# Patient Record
Sex: Female | Born: 1970 | Race: White | Hispanic: No | Marital: Single | Smoking: Former smoker
Health system: Southern US, Community
[De-identification: ages and names within clinical notes are randomized; demographics above are authoritative.]

## PROBLEM LIST (undated history)

## (undated) DIAGNOSIS — Z87410 Personal history of cervical dysplasia: Secondary | ICD-10-CM

## (undated) DIAGNOSIS — I219 Acute myocardial infarction, unspecified: Secondary | ICD-10-CM

## (undated) DIAGNOSIS — N200 Calculus of kidney: Secondary | ICD-10-CM

## (undated) DIAGNOSIS — Z87891 Personal history of nicotine dependence: Secondary | ICD-10-CM

## (undated) DIAGNOSIS — I2510 Atherosclerotic heart disease of native coronary artery without angina pectoris: Secondary | ICD-10-CM

## (undated) DIAGNOSIS — F1210 Cannabis abuse, uncomplicated: Secondary | ICD-10-CM

## (undated) HISTORY — PX: TUBAL LIGATION: SHX77

## (undated) HISTORY — PX: CHOLECYSTECTOMY: SHX55

## (undated) HISTORY — PX: COLONOSCOPY: SHX174

## (undated) HISTORY — DX: Personal history of nicotine dependence: Z87.891

## (undated) HISTORY — PX: CARDIAC CATHETERIZATION: SHX172

## (undated) HISTORY — DX: Personal history of cervical dysplasia: Z87.410

## (undated) HISTORY — PX: MOUTH SURGERY: SHX715

## (undated) HISTORY — PX: CERVICAL CONIZATION W/BX: SHX1330

## (undated) HISTORY — DX: Calculus of kidney: N20.0

## (undated) HISTORY — PX: LAPAROSCOPIC OVARIAN CYSTECTOMY: SUR786

## (undated) HISTORY — DX: Atherosclerotic heart disease of native coronary artery without angina pectoris: I25.10

## (undated) HISTORY — PX: ENDOMETRIAL ABLATION: SHX621

## (undated) HISTORY — DX: Cannabis abuse, uncomplicated: F12.10

---

## 1997-03-21 ENCOUNTER — Ambulatory Visit (HOSPITAL_COMMUNITY): Admission: RE | Admit: 1997-03-21 | Discharge: 1997-03-21 | Payer: Self-pay | Admitting: Obstetrics and Gynecology

## 1997-04-30 ENCOUNTER — Emergency Department (HOSPITAL_COMMUNITY): Admission: EM | Admit: 1997-04-30 | Discharge: 1997-04-30 | Payer: Self-pay | Admitting: Emergency Medicine

## 1998-09-30 ENCOUNTER — Emergency Department (HOSPITAL_COMMUNITY): Admission: EM | Admit: 1998-09-30 | Discharge: 1998-09-30 | Payer: Self-pay

## 1998-12-31 ENCOUNTER — Ambulatory Visit (HOSPITAL_COMMUNITY): Admission: RE | Admit: 1998-12-31 | Discharge: 1998-12-31 | Payer: Self-pay | Admitting: Internal Medicine

## 1998-12-31 ENCOUNTER — Encounter: Payer: Self-pay | Admitting: Internal Medicine

## 1999-10-05 ENCOUNTER — Inpatient Hospital Stay (HOSPITAL_COMMUNITY): Admission: AD | Admit: 1999-10-05 | Discharge: 1999-10-05 | Payer: Self-pay | Admitting: Obstetrics and Gynecology

## 1999-10-05 ENCOUNTER — Encounter: Payer: Self-pay | Admitting: Obstetrics and Gynecology

## 1999-11-02 ENCOUNTER — Inpatient Hospital Stay (HOSPITAL_COMMUNITY): Admission: AD | Admit: 1999-11-02 | Discharge: 1999-11-02 | Payer: Self-pay | Admitting: Obstetrics and Gynecology

## 1999-11-05 ENCOUNTER — Observation Stay (HOSPITAL_COMMUNITY): Admission: RE | Admit: 1999-11-05 | Discharge: 1999-11-05 | Payer: Self-pay | Admitting: Obstetrics and Gynecology

## 1999-12-18 ENCOUNTER — Emergency Department (HOSPITAL_COMMUNITY): Admission: EM | Admit: 1999-12-18 | Discharge: 1999-12-18 | Payer: Self-pay

## 2000-01-13 ENCOUNTER — Inpatient Hospital Stay (HOSPITAL_COMMUNITY): Admission: AD | Admit: 2000-01-13 | Discharge: 2000-01-13 | Payer: Self-pay | Admitting: Obstetrics & Gynecology

## 2000-02-14 ENCOUNTER — Inpatient Hospital Stay (HOSPITAL_COMMUNITY): Admission: AD | Admit: 2000-02-14 | Discharge: 2000-02-14 | Payer: Self-pay | Admitting: Obstetrics and Gynecology

## 2000-02-22 ENCOUNTER — Inpatient Hospital Stay (HOSPITAL_COMMUNITY): Admission: AD | Admit: 2000-02-22 | Discharge: 2000-02-27 | Payer: Self-pay | Admitting: Obstetrics and Gynecology

## 2000-02-23 ENCOUNTER — Encounter: Payer: Self-pay | Admitting: Obstetrics & Gynecology

## 2000-03-05 ENCOUNTER — Inpatient Hospital Stay (HOSPITAL_COMMUNITY): Admission: AD | Admit: 2000-03-05 | Discharge: 2000-03-05 | Payer: Self-pay | Admitting: Obstetrics and Gynecology

## 2000-03-08 ENCOUNTER — Inpatient Hospital Stay (HOSPITAL_COMMUNITY): Admission: AD | Admit: 2000-03-08 | Discharge: 2000-03-08 | Payer: Self-pay | Admitting: Obstetrics and Gynecology

## 2000-03-13 ENCOUNTER — Inpatient Hospital Stay (HOSPITAL_COMMUNITY): Admission: AD | Admit: 2000-03-13 | Discharge: 2000-03-16 | Payer: Self-pay | Admitting: Obstetrics and Gynecology

## 2000-03-17 ENCOUNTER — Encounter: Admission: RE | Admit: 2000-03-17 | Discharge: 2000-04-14 | Payer: Self-pay | Admitting: Obstetrics and Gynecology

## 2000-05-16 ENCOUNTER — Emergency Department (HOSPITAL_COMMUNITY): Admission: EM | Admit: 2000-05-16 | Discharge: 2000-05-16 | Payer: Self-pay | Admitting: Emergency Medicine

## 2001-02-13 ENCOUNTER — Other Ambulatory Visit: Admission: RE | Admit: 2001-02-13 | Discharge: 2001-02-13 | Payer: Self-pay | Admitting: Obstetrics and Gynecology

## 2001-04-13 ENCOUNTER — Encounter: Payer: Self-pay | Admitting: Urology

## 2001-04-13 ENCOUNTER — Encounter: Admission: RE | Admit: 2001-04-13 | Discharge: 2001-04-13 | Payer: Self-pay | Admitting: Urology

## 2001-04-14 ENCOUNTER — Observation Stay (HOSPITAL_COMMUNITY): Admission: EM | Admit: 2001-04-14 | Discharge: 2001-04-15 | Payer: Self-pay | Admitting: *Deleted

## 2001-04-14 ENCOUNTER — Encounter: Payer: Self-pay | Admitting: General Surgery

## 2001-07-18 ENCOUNTER — Emergency Department (HOSPITAL_COMMUNITY): Admission: EM | Admit: 2001-07-18 | Discharge: 2001-07-18 | Payer: Self-pay

## 2001-09-27 ENCOUNTER — Emergency Department (HOSPITAL_COMMUNITY): Admission: EM | Admit: 2001-09-27 | Discharge: 2001-09-27 | Payer: Self-pay | Admitting: Emergency Medicine

## 2001-10-13 ENCOUNTER — Emergency Department (HOSPITAL_COMMUNITY): Admission: EM | Admit: 2001-10-13 | Discharge: 2001-10-13 | Payer: Self-pay | Admitting: Emergency Medicine

## 2001-10-13 ENCOUNTER — Encounter: Payer: Self-pay | Admitting: Emergency Medicine

## 2001-10-17 ENCOUNTER — Emergency Department (HOSPITAL_COMMUNITY): Admission: EM | Admit: 2001-10-17 | Discharge: 2001-10-17 | Payer: Self-pay | Admitting: Emergency Medicine

## 2001-12-03 ENCOUNTER — Inpatient Hospital Stay (HOSPITAL_COMMUNITY): Admission: EM | Admit: 2001-12-03 | Discharge: 2001-12-05 | Payer: Self-pay | Admitting: Psychiatry

## 2001-12-29 ENCOUNTER — Encounter: Admission: RE | Admit: 2001-12-29 | Discharge: 2001-12-29 | Payer: Self-pay | Admitting: Psychiatry

## 2002-02-23 ENCOUNTER — Encounter: Admission: RE | Admit: 2002-02-23 | Discharge: 2002-02-23 | Payer: Self-pay | Admitting: *Deleted

## 2002-04-23 ENCOUNTER — Encounter: Admission: RE | Admit: 2002-04-23 | Discharge: 2002-04-23 | Payer: Self-pay | Admitting: *Deleted

## 2002-05-15 ENCOUNTER — Encounter: Admission: RE | Admit: 2002-05-15 | Discharge: 2002-06-07 | Payer: Self-pay | Admitting: Rheumatology

## 2002-08-17 ENCOUNTER — Encounter: Admission: RE | Admit: 2002-08-17 | Discharge: 2002-08-17 | Payer: Self-pay | Admitting: *Deleted

## 2002-08-20 ENCOUNTER — Encounter: Payer: Self-pay | Admitting: Emergency Medicine

## 2002-08-20 ENCOUNTER — Emergency Department (HOSPITAL_COMMUNITY): Admission: EM | Admit: 2002-08-20 | Discharge: 2002-08-20 | Payer: Self-pay | Admitting: Emergency Medicine

## 2002-12-30 ENCOUNTER — Emergency Department (HOSPITAL_COMMUNITY): Admission: EM | Admit: 2002-12-30 | Discharge: 2002-12-30 | Payer: Self-pay | Admitting: Emergency Medicine

## 2003-06-01 ENCOUNTER — Emergency Department (HOSPITAL_COMMUNITY): Admission: EM | Admit: 2003-06-01 | Discharge: 2003-06-01 | Payer: Self-pay | Admitting: Emergency Medicine

## 2003-06-27 ENCOUNTER — Emergency Department (HOSPITAL_COMMUNITY): Admission: EM | Admit: 2003-06-27 | Discharge: 2003-06-28 | Payer: Self-pay | Admitting: Emergency Medicine

## 2003-07-30 ENCOUNTER — Inpatient Hospital Stay (HOSPITAL_COMMUNITY): Admission: EM | Admit: 2003-07-30 | Discharge: 2003-08-02 | Payer: Self-pay | Admitting: Emergency Medicine

## 2003-12-15 ENCOUNTER — Emergency Department (HOSPITAL_COMMUNITY): Admission: EM | Admit: 2003-12-15 | Discharge: 2003-12-16 | Payer: Self-pay | Admitting: Emergency Medicine

## 2003-12-16 ENCOUNTER — Emergency Department (HOSPITAL_COMMUNITY): Admission: EM | Admit: 2003-12-16 | Discharge: 2003-12-16 | Payer: Self-pay | Admitting: Emergency Medicine

## 2004-01-16 ENCOUNTER — Ambulatory Visit (HOSPITAL_COMMUNITY): Admission: RE | Admit: 2004-01-16 | Discharge: 2004-01-16 | Payer: Self-pay | Admitting: Internal Medicine

## 2004-01-24 ENCOUNTER — Ambulatory Visit (HOSPITAL_COMMUNITY): Admission: RE | Admit: 2004-01-24 | Discharge: 2004-01-24 | Payer: Self-pay | Admitting: Internal Medicine

## 2004-02-05 ENCOUNTER — Ambulatory Visit (HOSPITAL_COMMUNITY): Payer: Self-pay | Admitting: Psychiatry

## 2004-03-24 ENCOUNTER — Emergency Department (HOSPITAL_COMMUNITY): Admission: EM | Admit: 2004-03-24 | Discharge: 2004-03-25 | Payer: Self-pay | Admitting: Emergency Medicine

## 2004-04-15 ENCOUNTER — Ambulatory Visit (HOSPITAL_COMMUNITY): Payer: Self-pay | Admitting: Psychiatry

## 2004-05-11 ENCOUNTER — Ambulatory Visit (HOSPITAL_COMMUNITY): Payer: Self-pay | Admitting: Psychiatry

## 2004-06-08 ENCOUNTER — Ambulatory Visit (HOSPITAL_COMMUNITY): Payer: Self-pay | Admitting: Psychiatry

## 2004-07-25 ENCOUNTER — Emergency Department (HOSPITAL_COMMUNITY): Admission: EM | Admit: 2004-07-25 | Discharge: 2004-07-25 | Payer: Self-pay | Admitting: Emergency Medicine

## 2004-09-01 ENCOUNTER — Encounter: Admission: RE | Admit: 2004-09-01 | Discharge: 2004-09-01 | Payer: Self-pay | Admitting: Internal Medicine

## 2004-09-02 ENCOUNTER — Ambulatory Visit (HOSPITAL_COMMUNITY): Payer: Self-pay | Admitting: Psychiatry

## 2004-09-09 ENCOUNTER — Ambulatory Visit (HOSPITAL_COMMUNITY): Payer: Self-pay | Admitting: Psychiatry

## 2004-09-16 ENCOUNTER — Ambulatory Visit (HOSPITAL_COMMUNITY): Payer: Self-pay | Admitting: Psychiatry

## 2004-09-23 ENCOUNTER — Ambulatory Visit (HOSPITAL_COMMUNITY): Payer: Self-pay | Admitting: Psychiatry

## 2004-10-06 ENCOUNTER — Emergency Department (HOSPITAL_COMMUNITY): Admission: EM | Admit: 2004-10-06 | Discharge: 2004-10-06 | Payer: Self-pay | Admitting: *Deleted

## 2004-10-07 ENCOUNTER — Emergency Department (HOSPITAL_COMMUNITY): Admission: EM | Admit: 2004-10-07 | Discharge: 2004-10-07 | Payer: Self-pay | Admitting: Emergency Medicine

## 2004-10-14 ENCOUNTER — Ambulatory Visit (HOSPITAL_COMMUNITY): Payer: Self-pay | Admitting: Psychiatry

## 2004-10-21 ENCOUNTER — Ambulatory Visit (HOSPITAL_COMMUNITY): Payer: Self-pay | Admitting: Psychiatry

## 2004-11-17 ENCOUNTER — Ambulatory Visit (HOSPITAL_COMMUNITY): Payer: Self-pay | Admitting: Psychiatry

## 2005-03-24 ENCOUNTER — Emergency Department (HOSPITAL_COMMUNITY): Admission: EM | Admit: 2005-03-24 | Discharge: 2005-03-24 | Payer: Self-pay | Admitting: Emergency Medicine

## 2005-03-27 ENCOUNTER — Emergency Department (HOSPITAL_COMMUNITY): Admission: EM | Admit: 2005-03-27 | Discharge: 2005-03-28 | Payer: Self-pay | Admitting: Emergency Medicine

## 2010-02-08 ENCOUNTER — Encounter: Payer: Self-pay | Admitting: Internal Medicine

## 2013-12-07 ENCOUNTER — Telehealth: Payer: Self-pay | Admitting: Cardiology

## 2013-12-07 ENCOUNTER — Emergency Department: Payer: Self-pay | Admitting: Emergency Medicine

## 2013-12-07 LAB — URINALYSIS, COMPLETE
BILIRUBIN, UR: NEGATIVE
Glucose,UR: NEGATIVE mg/dL (ref 0–75)
Ketone: NEGATIVE
Leukocyte Esterase: NEGATIVE
NITRITE: NEGATIVE
PH: 6 (ref 4.5–8.0)
Protein: NEGATIVE
SPECIFIC GRAVITY: 1.002 (ref 1.003–1.030)
Squamous Epithelial: 18
WBC UR: 2 /HPF (ref 0–5)

## 2013-12-07 LAB — CBC WITH DIFFERENTIAL/PLATELET
Basophil #: 0.1 10*3/uL (ref 0.0–0.1)
Basophil %: 0.7 %
Eosinophil #: 0.2 10*3/uL (ref 0.0–0.7)
Eosinophil %: 1.4 %
HCT: 46.4 % (ref 35.0–47.0)
HGB: 14.9 g/dL (ref 12.0–16.0)
Lymphocyte #: 1.5 10*3/uL (ref 1.0–3.6)
Lymphocyte %: 12.3 %
MCH: 31.6 pg (ref 26.0–34.0)
MCHC: 32 g/dL (ref 32.0–36.0)
MCV: 99 fL (ref 80–100)
Monocyte #: 0.6 x10 3/mm (ref 0.2–0.9)
Monocyte %: 5 %
Neutrophil #: 9.9 10*3/uL — ABNORMAL HIGH (ref 1.4–6.5)
Neutrophil %: 80.6 %
Platelet: 261 10*3/uL (ref 150–440)
RBC: 4.69 10*6/uL (ref 3.80–5.20)
RDW: 14.6 % — ABNORMAL HIGH (ref 11.5–14.5)
WBC: 12.3 10*3/uL — ABNORMAL HIGH (ref 3.6–11.0)

## 2013-12-07 LAB — COMPREHENSIVE METABOLIC PANEL
ALBUMIN: 4.1 g/dL (ref 3.4–5.0)
ALK PHOS: 78 U/L
AST: 20 U/L (ref 15–37)
Anion Gap: 5 — ABNORMAL LOW (ref 7–16)
BUN: 11 mg/dL (ref 7–18)
Bilirubin,Total: 0.4 mg/dL (ref 0.2–1.0)
CREATININE: 0.86 mg/dL (ref 0.60–1.30)
Calcium, Total: 9.4 mg/dL (ref 8.5–10.1)
Chloride: 111 mmol/L — ABNORMAL HIGH (ref 98–107)
Co2: 25 mmol/L (ref 21–32)
EGFR (African American): >60
Glucose: 84 mg/dL (ref 65–99)
OSMOLALITY: 280 (ref 275–301)
POTASSIUM: 4 mmol/L (ref 3.5–5.1)
SGPT (ALT): 25 U/L
Sodium: 141 mmol/L (ref 136–145)
Total Protein: 8.6 g/dL — ABNORMAL HIGH (ref 6.4–8.2)

## 2013-12-07 LAB — TROPONIN I: Troponin-I: <0.02 ng/mL

## 2013-12-07 NOTE — Telephone Encounter (Signed)
Pt is Molly Ellis and dc williams granddaughter, her aunt and uncle also see Dr. Mare Ferrari, EKG showed prior damage at [facility], wants to see Dr. Mare Ferrari as a new pt, will he take her on? pls call [phone]

## 2013-12-07 NOTE — Telephone Encounter (Signed)
Yes we could see her as a new patient

## 2013-12-07 NOTE — Telephone Encounter (Signed)
The pt is advised that Dr Mare Ferrari and his nurse are not in the office at this time and that I will forward this message to them to address when they return on Monday 11/23. She verbalized understanding.

## 2013-12-10 ENCOUNTER — Ambulatory Visit (INDEPENDENT_AMBULATORY_CARE_PROVIDER_SITE_OTHER): Payer: Medicaid Other | Admitting: Cardiology

## 2013-12-10 ENCOUNTER — Encounter: Payer: Self-pay | Admitting: Cardiology

## 2013-12-10 VITALS — BP 130/70 | HR 61 | Ht 66.0 in | Wt 110.0 lb

## 2013-12-10 DIAGNOSIS — E7800 Pure hypercholesterolemia, unspecified: Secondary | ICD-10-CM

## 2013-12-10 DIAGNOSIS — I4510 Unspecified right bundle-branch block: Secondary | ICD-10-CM

## 2013-12-10 DIAGNOSIS — R0789 Other chest pain: Secondary | ICD-10-CM

## 2013-12-10 DIAGNOSIS — G43109 Migraine with aura, not intractable, without status migrainosus: Secondary | ICD-10-CM

## 2013-12-10 DIAGNOSIS — Z720 Tobacco use: Secondary | ICD-10-CM

## 2013-12-10 DIAGNOSIS — G43909 Migraine, unspecified, not intractable, without status migrainosus: Secondary | ICD-10-CM

## 2013-12-10 DIAGNOSIS — R079 Chest pain, unspecified: Secondary | ICD-10-CM

## 2013-12-10 HISTORY — DX: Pure hypercholesterolemia, unspecified: E78.00

## 2013-12-10 HISTORY — DX: Tobacco use: Z72.0

## 2013-12-10 HISTORY — DX: Chest pain, unspecified: R07.9

## 2013-12-10 HISTORY — DX: Unspecified right bundle-branch block: I45.10

## 2013-12-10 HISTORY — DX: Migraine, unspecified, not intractable, without status migrainosus: G43.909

## 2013-12-10 NOTE — Patient Instructions (Signed)
START ASPIRIN 81 MG DAILY   Your physician has requested that you have en exercise stress myoview. For further information please visit [email or web address]. Please follow instruction sheet, as given.  Follow up as needed 

## 2013-12-10 NOTE — Telephone Encounter (Signed)
Patient scheduled for appointment today with  Dr. Mare Ferrari, patient aware

## 2013-12-10 NOTE — Progress Notes (Signed)
Molly Ellis Date of Birth:  1970/01/20 [facility] [address] [place], [place]  [address] [phone]        Fax   [phone]   History of Present Illness: This pleasant 43 year old woman is seen by me for the first time today.  I see her parents the Pansters and her maternal grandparents the Delmita. The patient is self-referred today.  She was recently seen in [facility] emergency room for left-sided chest discomfort she was seen on 12/07/13.  Her chest x-ray was normal.  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  Historically there is no prior history of known heart problems.  She gives a history that she has had exertional chest discomfort in the left lateral chest radiating down the left arm.  It seems to be relieved by rest or by taking Goody powders.  Goody powders containing aspirin.  In September 2015 she was seen in an emergency room after having syncope  after emerging from a hot tub. Her family history reveals that her mother has heart problems.  Her social history reveals that she smokes a pack of cigarettes a day.  She has a past history of elevated cholesterol but is not currently taking any cholesterol medication.  He has a past history of Lyme disease.  Her family physician is Dr. Lin Landsman at [facility] family practice in [place].  Current Outpatient Prescriptions  Medication Sig Dispense Refill  . aspirin 81 MG tablet Take 81 mg by mouth daily.    . Aspirin-Acetaminophen-Caffeine (GOODY HEADACHE PO) Take by mouth.     No current facility-administered medications for this visit.    Allergies  Allergen Reactions  . Penicillins Other (See Comments)    Patient Active Problem List   Diagnosis Date Noted  . Chest pain of uncertain etiology 75/10/2583  . RBBB 12/10/2013  . Hypercholesterolemia 12/10/2013  . Migraine 12/10/2013  . Tobacco abuse 12/10/2013     History  Smoking status  . Former Smoker  Smokeless tobacco  . Not on file    History  Alcohol Use  . Yes    Family History  Problem Relation Age of Onset  . Asthma Mother   . Hypertension Mother   . Heart failure Maternal Grandfather   . Heart attack Maternal Grandfather     Review of Systems: Constitutional: no fever chills diaphoresis or fatigue or change in weight.  Head and neck: no hearing loss, no epistaxis, no photophobia or visual disturbance. Respiratory: No cough, shortness of breath or wheezing. Cardiovascular: No chest pain peripheral edema, palpitations. Gastrointestinal: No abdominal distention, no abdominal pain, no change in bowel habits hematochezia or melena. Genitourinary: No dysuria, no frequency, no urgency, no nocturia. Musculoskeletal:No arthralgias, no back pain, no gait disturbance or myalgias. Neurological: No dizziness, no headaches, no numbness, no seizures, no syncope, no weakness, no tremors. Hematologic: No lymphadenopathy, no easy bruising. Psychiatric: No confusion, no hallucinations, no sleep disturbance.   Wt Readings from Last 3 Encounters:  12/10/13 110 lb (49.896 kg)    Physical Exam: Filed Vitals:   12/10/13 1600  BP: 130/70  Pulse: 61   The patient appears to be in no distress.  Head and neck exam reveals that the pupils are equal and reactive.  The extraocular movements are full.  There is no scleral icterus.  Mouth and pharynx are benign.  No lymphadenopathy.  No carotid bruits.  The jugular venous pressure  is normal.  Thyroid is not enlarged or tender.  Chest is clear to percussion and auscultation.  No rales or rhonchi.  Expansion of the chest is symmetrical.  Heart reveals no abnormal lift or heave.  First and second heart sounds are normal.  There is no murmur gallop rub or click.  The abdomen is soft and nontender.  Bowel sounds are normoactive.  There is no hepatosplenomegaly or mass.  There are no abdominal  bruits.  Extremities reveal no phlebitis or edema.  Pedal pulses are good.  There is no cyanosis or clubbing.  Neurologic exam is normal strength and no lateralizing weakness.  No sensory deficits.  Integument reveals no rash  EKG today shows normal sinus rhythm, right bundle branch block, and deep narrow Q waves in the inferior leads cannot rule out inferior infarct.  Previous tracing of 12/07/13, no significant change.  Assessment / Plan: 1.  Atypical chest pain uncertain etiology 2.  Abnormal electrocardiogram with right bundle branch block and questionable old inferior wall MI 3.  Ongoing tobacco abuse 1 pack a day 4.  History of migraine headache 5.  History of hypercholesterolemia  Disposition: We will have the patient return for a treadmill Myoview stress test.  She is to take a baby aspirin once a day

## 2013-12-12 ENCOUNTER — Telehealth: Payer: Self-pay | Admitting: Cardiology

## 2013-12-12 ENCOUNTER — Ambulatory Visit (HOSPITAL_COMMUNITY): Payer: Medicaid Other | Attending: Cardiology | Admitting: Radiology

## 2013-12-12 DIAGNOSIS — R079 Chest pain, unspecified: Secondary | ICD-10-CM | POA: Insufficient documentation

## 2013-12-12 DIAGNOSIS — Z681 Body mass index (BMI) 19 or less, adult: Secondary | ICD-10-CM | POA: Insufficient documentation

## 2013-12-12 DIAGNOSIS — I4510 Unspecified right bundle-branch block: Secondary | ICD-10-CM | POA: Diagnosis not present

## 2013-12-12 DIAGNOSIS — R0789 Other chest pain: Secondary | ICD-10-CM

## 2013-12-12 MED ORDER — TECHNETIUM TC 99M SESTAMIBI GENERIC - CARDIOLITE
10.0000 | Freq: Once | INTRAVENOUS | Status: AC | PRN
  Administered 2013-12-12: 10 via INTRAVENOUS

## 2013-12-12 MED ORDER — TECHNETIUM TC 99M SESTAMIBI GENERIC - CARDIOLITE
30.0000 | Freq: Once | INTRAVENOUS | Status: AC | PRN
  Administered 2013-12-12: 30 via INTRAVENOUS

## 2013-12-12 NOTE — Telephone Encounter (Signed)
Spoke with patient and she stated she was continuing to have chest pains since myoview.  She had been having chest pains constant since last week rating 2/10, after myoview pain up to 5/10 Discussed with  Dr. Mare Ferrari and he recommended to rest and if worse go to ED  Advised patient, verbalized understanding.

## 2013-12-12 NOTE — Telephone Encounter (Signed)
New message      Want nuclear results----pt was seen this am.  She said someone told her we would have the results back this pm.  Pt is still having occasional chest pain (just like she was having this am during the study)

## 2013-12-12 NOTE — Progress Notes (Signed)
[facility] [address] [place], [place] [address] [phone]    Cardiology Nuclear Med Study  Molly Ellis is a 43 y.o. female     MRN : [id number]     DOB: [birth year]  Procedure Date: 12/12/2013  Nuclear Med Background Indication for Stress Test:  Evaluation for Ischemia, Abnormal EKG, and Patient seen in hospital on 12-07-2013 for Chest Pain ([place]) History:  No known CAD Cardiac Risk Factors: Lipids and RBBB  Symptoms:  Chest Pain, Chest Pain with Exertion (chest discomfort is currently a 2/10) and Syncope   Nuclear Pre-Procedure Caffeine/Decaff Intake:  None> 12 hrs NPO After: 8:00pm   Lungs:  clear O2 Sat: 98% on room air. IV 0.9% NS with Angio Cath:  24g  IV Site: L Antecubital x 1, tolerated well IV Started by:  Irven Baltimore, RN  Chest Size (in):  32 Cup Size: B  Height: 5\' 6"  (1.676 m)  Weight:  107 lb (48.535 kg)  BMI:  Body mass index is 17.28 kg/(m^2). Tech Comments:  N/A    Nuclear Med Study 1 or 2 day study: 1 day  Stress Test Type:  Stress  Reading MD: N/A  Order Authorizing Provider:  Darlin Coco, MD  Resting Radionuclide: Technetium 31m Sestamibi  Resting Radionuclide Dose: 11.0 mCi   Stress Radionuclide:  Technetium 31m Sestamibi  Stress Radionuclide Dose: 33.0 mCi           Stress Protocol Rest HR: 49 Stress HR: 153  Rest BP: 113/78 Stress BP: 169/78  Exercise Time (min): 6:00 METS: fatigue   Predicted Max HR: 178 bpm % Max HR: 85.96 bpm Rate Pressure Product: 63149   Dose of Adenosine (mg):  n/a Dose of Lexiscan: n/a mg  Dose of Atropine (mg): n/a Dose of Dobutamine: n/a mcg/kg/min (at max HR)  Stress Test Technologist: Glade Lloyd, BS-ES  Nuclear Technologist:  Earl Many, CNMT     Rest Procedure:  Myocardial perfusion imaging was performed at rest 45 minutes following the intravenous administration of Technetium 47m Sestamibi. Rest ECG: NSR-RBBB  Stress Procedure:  The patient exercised  on the treadmill utilizing the Bruce Protocol for 6:00 minutes. The patient stopped due to fatigue and had a increase from a 2/10 chest pain at rest to a 5/10 chest pain during exercise.  Technetium 32m Sestamibi was injected at peak exercise and myocardial perfusion imaging was performed after a brief delay.  Stress ECG: No significant change from baseline ECG  QPS Raw Data Images:  Normal; no motion artifact; normal heart/lung ratio. Stress Images:  Small, mild mid anterior perfusion defect.  Rest Images:  Small, mild mid anterior perfusion defect.  Subtraction (SDS):  Fixed small, mild mid anterior perfusion defect.  Transient Ischemic Dilatation (Normal <1.22):  1.02 Lung/Heart Ratio (Normal <0.45):  0.31  Quantitative Gated Spect Images QGS EDV:  69 ml QGS ESV:  19 ml  Impression Exercise Capacity:  Poor exercise capacity. BP Response:  Normal blood pressure response. Clinical Symptoms:  Chest pain ECG Impression:  RBBB, no significant change with exercise. Comparison with Prior Nuclear Study: No images to compare  Overall Impression:  Low risk stress nuclear study with a small, mild mid anterior fixed perfusion defect.  This likely represents breast attenuation.  No ischemia.  The patient did have chest pain and poor exercise tolerance based on her age.   LV Ejection Fraction: 73%.  LV Wall Motion:  NL LV Function; NL Wall Motion   [employer]  12/12/2013          

## 2013-12-14 ENCOUNTER — Telehealth: Payer: Self-pay | Admitting: Cardiology

## 2013-12-14 NOTE — Telephone Encounter (Signed)
Follow Up    Pt calling following up on test results. Please call.

## 2013-12-14 NOTE — Telephone Encounter (Signed)
Advised patient we do not have these results yet, and she was not happy because she was told they would be ready.

## 2013-12-17 NOTE — Telephone Encounter (Signed)
Advised patient of results and  Dr. Mare Ferrari recommended following up with PCP

## 2013-12-17 NOTE — Telephone Encounter (Signed)
-----   Message from Darlin Coco, MD sent at 12/14/2013  5:44 PM EST ----- Please report.  Stress test was normal.  No ischemia. EF normal. Send copy to Dr. Lin Landsman at [facility] family practice.

## 2013-12-27 ENCOUNTER — Encounter: Payer: Self-pay | Admitting: Cardiology

## 2013-12-30 ENCOUNTER — Emergency Department: Payer: Self-pay | Admitting: Emergency Medicine

## 2014-01-18 DIAGNOSIS — I219 Acute myocardial infarction, unspecified: Secondary | ICD-10-CM

## 2014-01-18 HISTORY — DX: Acute myocardial infarction, unspecified: I21.9

## 2016-04-04 ENCOUNTER — Encounter (HOSPITAL_COMMUNITY): Payer: Self-pay | Admitting: Emergency Medicine

## 2016-04-04 ENCOUNTER — Emergency Department (HOSPITAL_COMMUNITY): Payer: Medicaid Other

## 2016-04-04 ENCOUNTER — Emergency Department (HOSPITAL_COMMUNITY)
Admission: EM | Admit: 2016-04-04 | Discharge: 2016-04-04 | Disposition: A | Discharge status: Home / Self Care | Payer: Medicaid Other | Attending: Emergency Medicine | Admitting: Emergency Medicine

## 2016-04-04 DIAGNOSIS — J9811 Atelectasis: Secondary | ICD-10-CM | POA: Diagnosis not present

## 2016-04-04 DIAGNOSIS — R079 Chest pain, unspecified: Secondary | ICD-10-CM | POA: Diagnosis not present

## 2016-04-04 DIAGNOSIS — Z87891 Personal history of nicotine dependence: Secondary | ICD-10-CM | POA: Insufficient documentation

## 2016-04-04 DIAGNOSIS — Z7982 Long term (current) use of aspirin: Secondary | ICD-10-CM | POA: Insufficient documentation

## 2016-04-04 DIAGNOSIS — R0602 Shortness of breath: Secondary | ICD-10-CM | POA: Diagnosis present

## 2016-04-04 HISTORY — DX: Acute myocardial infarction, unspecified: I21.9

## 2016-04-04 LAB — I-STAT TROPONIN, ED
TROPONIN I, POC: 0 ng/mL (ref 0.00–0.08)
Troponin i, poc: 0 ng/mL (ref 0.00–0.08)

## 2016-04-04 LAB — I-STAT BETA HCG BLOOD, ED (MC, WL, AP ONLY)

## 2016-04-04 LAB — BASIC METABOLIC PANEL
Anion gap: 5 (ref 5–15)
BUN: 12 mg/dL (ref 6–20)
CHLORIDE: 108 mmol/L (ref 101–111)
CO2: 23 mmol/L (ref 22–32)
CREATININE: 0.92 mg/dL (ref 0.44–1.00)
Calcium: 8.8 mg/dL — ABNORMAL LOW (ref 8.9–10.3)
GFR calc Af Amer: >60 mL/min (ref >60)
GFR calc non Af Amer: >60 mL/min (ref >60)
Glucose, Bld: 100 mg/dL — ABNORMAL HIGH (ref 65–99)
Potassium: 3.7 mmol/L (ref 3.5–5.1)
SODIUM: 136 mmol/L (ref 135–145)

## 2016-04-04 LAB — CBC
HEMATOCRIT: 36.7 % (ref 36.0–46.0)
HEMOGLOBIN: 12.3 g/dL (ref 12.0–15.0)
MCH: 31.9 pg (ref 26.0–34.0)
MCHC: 33.5 g/dL (ref 30.0–36.0)
MCV: 95.3 fL (ref 78.0–100.0)
PLATELETS: 232 10*3/uL (ref 150–400)
RBC: 3.85 MIL/uL — AB (ref 3.87–5.11)
RDW: 13.5 % (ref 11.5–15.5)
WBC: 6.8 10*3/uL (ref 4.0–10.5)

## 2016-04-04 LAB — T4, FREE: FREE T4: 1.12 ng/dL (ref 0.61–1.12)

## 2016-04-04 LAB — TSH: TSH: 2.811 u[IU]/mL (ref 0.350–4.500)

## 2016-04-04 LAB — D-DIMER, QUANTITATIVE: D-Dimer, Quant: 0.58 ug/mL-FEU — ABNORMAL HIGH (ref 0.00–0.50)

## 2016-04-04 MED ORDER — METHOCARBAMOL 500 MG PO TABS: 1000.0000 mg | ORAL_TABLET | Freq: Three times a day (TID) | ORAL | 0 refills | Status: DC | PRN

## 2016-04-04 MED ORDER — IBUPROFEN 600 MG PO TABS: 600.0000 mg | ORAL_TABLET | Freq: Three times a day (TID) | ORAL | 0 refills | Status: DC

## 2016-04-04 MED ORDER — KETOROLAC TROMETHAMINE 30 MG/ML IJ SOLN
30.0000 mg | Freq: Once | INTRAMUSCULAR | Status: AC
  Administered 2016-04-04: 30 mg via INTRAVENOUS
  Filled 2016-04-04: qty 1

## 2016-04-04 MED ORDER — SODIUM CHLORIDE 0.9 % IV BOLUS (SEPSIS)
1000.0000 mL | Freq: Once | INTRAVENOUS | Status: AC
Start: 2016-04-04 — End: 2016-04-04
  Administered 2016-04-04: 1000 mL via INTRAVENOUS

## 2016-04-04 MED ORDER — IOPAMIDOL (ISOVUE-370) INJECTION 76%
INTRAVENOUS | Status: AC
  Administered 2016-04-04: 100 mL
  Filled 2016-04-04: qty 100

## 2016-04-04 MED ORDER — SODIUM CHLORIDE 0.9 % IV BOLUS (SEPSIS)
1000.0000 mL | Freq: Once | INTRAVENOUS | Status: AC
  Administered 2016-04-04: 1000 mL via INTRAVENOUS

## 2016-04-04 NOTE — ED Notes (Signed)
Patient transported to X-ray 

## 2016-04-04 NOTE — ED Notes (Signed)
Attempted IV x2. 

## 2016-04-04 NOTE — ED Notes (Signed)
Attempted PIV for CTA. Aaron Edelman, RN will attempt.

## 2016-04-04 NOTE — ED Triage Notes (Signed)
Pt BIB EMS from her son's apartment where pt woke up from nap with 8/10 CP. Pt has hx MI x 2 years ago. Pt in afib with EMS, in NSR on arrival. Pt received 1 nitro, 4mg  zofran, and 324 asp with ems. Resp e/u; A&O x 4. NAD noted at this time.

## 2016-04-04 NOTE — ED Notes (Signed)
Rn at bedside attempting [place] IV

## 2016-04-04 NOTE — ED Notes (Signed)
Iv team at bedside  

## 2016-04-04 NOTE — ED Notes (Signed)
Patient transported to CT 

## 2016-04-04 NOTE — ED Notes (Signed)
Pt c/o of pain with inspiration

## 2016-04-04 NOTE — ED Notes (Signed)
ED Provider at bedside. 

## 2016-04-04 NOTE — ED Notes (Signed)
IV team at bedside 

## 2016-04-04 NOTE — ED Provider Notes (Signed)
[facility] DEPT Provider Note   CSN: [id number] Arrival date & time: 04/04/16  1600     History   Chief Complaint Chief Complaint  Patient presents with  . Chest Pain    HPI Molly Ellis is a 46 y.o. female.  HPI She presents with left-sided chest pain that she describes as sharp. Located under the left axilla. He has had some shortness of breath associated with this. Describes as difficulty taking a full breath. Symptoms started roughly 30 minutes prior to presentation. No fever, chills or cough. No new lower extremity swelling or pain. No recent extended travel or immobilization. Patient thinks she's had 2 small "microinfarcts in the past. States she seen Dr. Mare Ferrari for this. She had no treatment at that time. She smokes 4-10 cigarettes daily. Grandfather with history of MI at an early age. Given aspirin and nitroglycerin in route. Pain is now improved to 4/10. EMS noted patient to be tachycardic en route. Questioned atrial fibrillation on run strip. Past Medical History:  Diagnosis Date  . Heart attack 01/2014  . Kidney stones     Patient Active Problem List   Diagnosis Date Noted  . Chest pain of uncertain etiology 93/81/8299  . RBBB 12/10/2013  . Hypercholesterolemia 12/10/2013  . Migraine 12/10/2013  . Tobacco abuse 12/10/2013    Past Surgical History:  Procedure Laterality Date  . CARDIAC CATHETERIZATION    . CHOLECYSTECTOMY      OB History    No data available       Home Medications    Prior to Admission medications   Medication Sig Start Date End Date Taking? Authorizing Provider  Aspirin-Acetaminophen-Caffeine (GOODY HEADACHE PO) Take 1 packet by mouth daily as needed (for headaches).    Yes Historical Provider, MD  loratadine (CLARITIN) 10 MG tablet Take 10 mg by mouth every other day.   Yes Historical Provider, MD  PROAIR HFA 108 [phone] Base) MCG/ACT inhaler Inhale 1-2 puffs into the lungs See admin instructions. Every four to six hours as  needed for shortness of breath or wheezing (seasonal allergies) 03/12/16  Yes Historical Provider, MD  ibuprofen (ADVIL,MOTRIN) 600 MG tablet Take 1 tablet (600 mg total) by mouth 3 (three) times daily after meals. 04/04/16   Julianne Rice, MD  methocarbamol (ROBAXIN) 500 MG tablet Take 2 tablets (1,000 mg total) by mouth every 8 (eight) hours as needed. 04/04/16   Julianne Rice, MD    Family History Family History  Problem Relation Age of Onset  . Asthma Mother   . Hypertension Mother   . Heart failure Maternal Grandfather   . Heart attack Maternal Grandfather     Social History Social History  Substance Use Topics  . Smoking status: Former Research scientist (life sciences)  . Smokeless tobacco: Not on file  . Alcohol use Yes     Allergies   Penicillins; Codeine; and Other   Review of Systems Review of Systems  Constitutional: Negative for chills and fever.  Respiratory: Positive for shortness of breath. Negative for cough and chest tightness.   Cardiovascular: Positive for chest pain. Negative for palpitations and leg swelling.  Gastrointestinal: Negative for abdominal pain, constipation, diarrhea, nausea and vomiting.  Musculoskeletal: Positive for back pain and myalgias. Negative for neck pain.  Skin: Negative for rash and wound.  Neurological: Negative for dizziness, weakness, light-headedness, numbness and headaches.  All other systems reviewed and are negative.    Physical Exam Updated Vital Signs BP 110/62   Pulse 70   Temp 98.2 F (  36.8 C) (Oral)   Resp 17   Ht 5\' 6"  (1.676 m)   Wt 125 lb (56.7 kg)   SpO2 100%   BMI 20.18 kg/m   Physical Exam  Constitutional: She is oriented to person, place, and time. She appears well-developed and well-nourished. No distress.  HENT:  Head: Normocephalic and atraumatic.  Mouth/Throat: Oropharynx is clear and moist. No oropharyngeal exudate.  Eyes: EOM are normal. Pupils are equal, round, and reactive to light.  Neck: Normal range of motion.  Neck supple. No thyromegaly present.  Cardiovascular: Normal rate and regular rhythm.  Exam reveals no gallop and no friction rub.   No murmur heard. Pulmonary/Chest: Effort normal and breath sounds normal. No respiratory distress. She has no wheezes. She has no rales. She exhibits no tenderness.  Abdominal: Soft. Bowel sounds are normal. There is no tenderness. There is no rebound and no guarding.  Musculoskeletal: Normal range of motion. She exhibits tenderness. She exhibits no edema.  Patient with left sided thoracic muscular tenderness to palpation. There is no midline thoracic or lumbar tenderness. No CVA tenderness. No lower extremity swelling, asymmetry or tenderness. 2+ distal pulses.  Neurological: She is alert and oriented to person, place, and time.  Moving all extremities without deficit. Sensation fully intact.  Skin: Skin is warm and dry. Capillary refill takes less than 2 seconds. No rash noted. No erythema.  Psychiatric: She has a normal mood and affect. Her behavior is normal.  Nursing note and vitals reviewed.    ED Treatments / Results  Labs (all labs ordered are listed, but only abnormal results are displayed) Labs Reviewed  BASIC METABOLIC PANEL - Abnormal; Notable for the following:       Result Value   Glucose, Bld 100 (*)    Calcium 8.8 (*)    All other components within normal limits  CBC - Abnormal; Notable for the following:    RBC 3.85 (*)    All other components within normal limits  D-DIMER, QUANTITATIVE (NOT AT [facility]) - Abnormal; Notable for the following:    D-Dimer, Quant 0.58 (*)    All other components within normal limits  TSH  T4, FREE  I-STAT TROPOININ, ED  I-STAT BETA HCG BLOOD, ED ([facility], [facility], [facility] ONLY)  I-STAT TROPOININ, ED    EKG  EKG Interpretation  Date/Time:  Sunday April 04 2016 16:09:50 EDT Ventricular Rate:  65 PR Interval:    QRS Duration: 118 QT Interval:  374 QTC Calculation: 389 R Axis:   106 Text Interpretation:  Sinus  rhythm IRBBB and LPFB Confirmed by Lita Mains  MD, Shedric Fredericks ([id number]) on 04/04/2016 4:39:33 PM       Radiology No results found.  Procedures Procedures (including critical care time)  Medications Ordered in ED Medications  sodium chloride 0.9 % bolus 1,000 mL (0 mLs Intravenous Stopped 04/04/16 1811)  ketorolac (TORADOL) 30 MG/ML injection 30 mg (30 mg Intravenous Given 04/04/16 2116)  sodium chloride 0.9 % bolus 1,000 mL (0 mLs Intravenous Stopped 04/04/16 2258)  iopamidol (ISOVUE-370) 76 % injection (100 mLs  Contrast Given 04/04/16 2124)     Initial Impression / Assessment and Plan / ED Course  I have reviewed the triage vital signs and the nursing notes.  Pertinent labs & imaging results that were available during my care of the patient were reviewed by me and considered in my medical decision making (see chart for details).    Review of patient's run strips demonstrate sinus tachycardia with no evidence of  atrial fibrillation. Patient does have right bundle block branch block which is old. Was evaluated by Dr. Mare Ferrari in 2015 and had a stress test which was negative at that time. No definite history of prior myocardial infarction. She did have some Q waves in inferior leads on prior EKGs which did not appear on EKG performed today. Patient has CT angiogram of the chest which is negative for PE but does socially atelectasis in the left lung. Repeat troponin is normal. Low suspicion for coronary artery disease. We'll treat for chest wall pain and have follow-up as an outpatient. Return precautions given. Final Clinical Impressions(s) / ED Diagnoses   Final diagnoses:  Nonspecific chest pain  Atelectasis of left lung    New Prescriptions Discharge Medication List as of 04/04/2016 10:42 PM    START taking these medications   Details  ibuprofen (ADVIL,MOTRIN) 600 MG tablet Take 1 tablet (600 mg total) by mouth 3 (three) times daily after meals., Starting Sun 04/04/2016, Print      methocarbamol (ROBAXIN) 500 MG tablet Take 2 tablets (1,000 mg total) by mouth every 8 (eight) hours as needed., Starting Sun 04/04/2016, Print         Julianne Rice, MD 04/07/16 2135

## 2016-09-15 ENCOUNTER — Encounter: Payer: Self-pay | Admitting: Neurology

## 2016-12-28 ENCOUNTER — Ambulatory Visit: Payer: Medicaid Other | Admitting: Neurology

## 2017-01-28 ENCOUNTER — Encounter: Payer: Self-pay | Admitting: Neurology

## 2017-01-28 ENCOUNTER — Ambulatory Visit: Payer: Medicaid Other | Admitting: Neurology

## 2017-01-28 VITALS — BP 100/70 | HR 58 | Ht 66.0 in | Wt 135.4 lb

## 2017-01-28 DIAGNOSIS — R404 Transient alteration of awareness: Secondary | ICD-10-CM

## 2017-01-28 NOTE — Patient Instructions (Signed)
I don't think you are having TIAs.  I don't really suspect seizures, however it must be ruled out.  It is most likely complicated migraine or stress-related  We will check a sleep-deprived EEG.  If it shows any abnormalities that suggest risk for seizures, then I want to see you back to discuss possible anti-seizure medications.
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
## 2017-02-03 ENCOUNTER — Ambulatory Visit (INDEPENDENT_AMBULATORY_CARE_PROVIDER_SITE_OTHER): Payer: Medicaid Other | Admitting: Neurology

## 2017-02-03 DIAGNOSIS — R404 Transient alteration of awareness: Secondary | ICD-10-CM | POA: Diagnosis not present

## 2017-02-03 NOTE — Progress Notes (Signed)
SLEEP-DEPRIVED ELECTROENCEPHALOGRAM REPORT  Date of Study: 02/03/2017  Patient's Name: Molly Ellis MRN: [id number] Date of Birth: [birth year]  Referring Provider: Metta Clines, DO  Clinical History: 47 year old female with history of recurrent TIA spells, presenting as confusion and right sided weakness and numbness.   Medications: aspirin EC 81 MG tablet  ZYRTEC 10 MG tablet  GOODY HEADACHE PO ADVIL,MOTRIN 600 MG tablet  CLARITIN 10 MG tablet  PROAIR HFA 108 (90 Base) MCG/ACT inhaler   Technical Summary: A multichannel digital EEG recording measured by the international 10-20 system with electrodes applied with paste and impedances below 5000 ohms performed in our laboratory with EKG monitoring in an awake and asleep patient.  Hyperventilation and photic stimulation were performed.  The digital EEG was referentially recorded, reformatted, and digitally filtered in a variety of bipolar and referential montages for optimal display.    Description: The patient is awake and asleep during the recording.  During maximal wakefulness, there is a symmetric, medium voltage 11 Hz posterior dominant rhythm that attenuates with eye opening.  The record is symmetric.  During drowsiness and sleep, there is an increase in theta slowing of the background.  Vertex waves and symmetric sleep spindles were seen.  Hyperventilation and photic stimulation did not elicit any abnormalities.  There were no epileptiform discharges or electrographic seizures seen.    EKG lead was unremarkable.  Impression: This awake and asleep sleep-deprived EEG is normal.    Clinical Correlation: A normal EEG does not exclude a clinical diagnosis of epilepsy.  If further clinical questions remain, prolonged EEG may be helpful.  Clinical correlation is advised.   Metta Clines, DO

## 2017-02-10 ENCOUNTER — Telehealth: Payer: Self-pay | Admitting: Neurology

## 2017-02-10 NOTE — Telephone Encounter (Signed)
Patient called to check her results from her Sleep deprived EEG she had last week. Please Call. Thanks

## 2017-02-10 NOTE — Telephone Encounter (Signed)
Ok to advise Pt of normal EEG?

## 2017-02-11 NOTE — Telephone Encounter (Signed)
Pt called informed her that her EEG was normal and she wants to know where to go from here with her headaches she is having, please advise

## 2017-02-11 NOTE — Telephone Encounter (Signed)
Yes, let her know the EEG is normal.

## 2017-02-11 NOTE — Telephone Encounter (Signed)
Called Pt, LM on VM

## 2017-02-16 NOTE — Telephone Encounter (Signed)
She needs to make a follow up appointment since it is a separate issue

## 2017-02-16 NOTE — Telephone Encounter (Signed)
Called and spoke with Pt, she is having almost daily headaches in the back of her head, though not all of them progress into migraines. She said there was a discussion of returning, if headaches continued, to consider treatment. Please advise.

## 2017-02-16 NOTE — Telephone Encounter (Signed)
Called and spoke with Pt, advsd her will need to be seen, trx her to front office to make appt

## 2017-02-21 ENCOUNTER — Encounter: Payer: Self-pay | Admitting: Neurology

## 2017-02-21 ENCOUNTER — Ambulatory Visit: Payer: Medicaid Other | Admitting: Neurology

## 2017-02-21 VITALS — BP 102/78 | HR 97 | Ht 66.0 in | Wt 128.0 lb

## 2017-02-21 DIAGNOSIS — G43009 Migraine without aura, not intractable, without status migrainosus: Secondary | ICD-10-CM

## 2017-02-21 NOTE — Patient Instructions (Signed)
Migraine Recommendations: 1.  We will hold off on starting a daily preventative medication for now. 2.  Take ibuprofen 800mg  or Goody at earliest onset of headache. 3.  Limit use of pain relievers to no more than 2 days out of the week.  These medications include acetaminophen, ibuprofen, triptans and narcotics.  This will help reduce risk of rebound headaches. 4.  Be aware of common food triggers such as processed sweets, processed foods with nitrites (such as deli meat, hot dogs, sausages), foods with MSG, alcohol (such as wine), chocolate, certain cheeses, certain fruits (dried fruits, bananas, some citrus fruit), vinegar, diet soda. 4.  Avoid caffeine 5.  Routine exercise 6.  Proper sleep hygiene 7.  Stay adequately hydrated with water 8.  Keep a headache diary. 9.  Maintain proper stress management. 10.  Do not skip meals. 11.  Consider supplements:  Magnesium citrate 400mg  to 600mg  daily, riboflavin 400mg , Coenzyme Q 10 100mg  three times daily 12.  Follow up in 3 months.
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
## 2017-05-27 ENCOUNTER — Ambulatory Visit: Payer: Medicaid Other | Admitting: Neurology

## 2017-11-02 ENCOUNTER — Emergency Department: Payer: Medicaid Other

## 2017-11-02 ENCOUNTER — Encounter: Payer: Self-pay | Admitting: Emergency Medicine

## 2017-11-02 ENCOUNTER — Emergency Department
Admission: EM | Admit: 2017-11-02 | Discharge: 2017-11-02 | Disposition: A | Discharge status: Home / Self Care | Payer: Medicaid Other | Attending: Emergency Medicine | Admitting: Emergency Medicine

## 2017-11-02 DIAGNOSIS — N83201 Unspecified ovarian cyst, right side: Secondary | ICD-10-CM

## 2017-11-02 DIAGNOSIS — Z7982 Long term (current) use of aspirin: Secondary | ICD-10-CM | POA: Diagnosis not present

## 2017-11-02 DIAGNOSIS — N12 Tubulo-interstitial nephritis, not specified as acute or chronic: Secondary | ICD-10-CM

## 2017-11-02 DIAGNOSIS — N10 Acute tubulo-interstitial nephritis: Secondary | ICD-10-CM | POA: Insufficient documentation

## 2017-11-02 DIAGNOSIS — Z79899 Other long term (current) drug therapy: Secondary | ICD-10-CM | POA: Insufficient documentation

## 2017-11-02 DIAGNOSIS — Z87891 Personal history of nicotine dependence: Secondary | ICD-10-CM | POA: Insufficient documentation

## 2017-11-02 DIAGNOSIS — R102 Pelvic and perineal pain: Secondary | ICD-10-CM

## 2017-11-02 DIAGNOSIS — R1031 Right lower quadrant pain: Secondary | ICD-10-CM | POA: Diagnosis present

## 2017-11-02 LAB — CBC
HEMATOCRIT: 44.7 % (ref 36.0–46.0)
HEMOGLOBIN: 14.4 g/dL (ref 12.0–15.0)
MCH: 31.7 pg (ref 26.0–34.0)
MCHC: 32.2 g/dL (ref 30.0–36.0)
MCV: 98.5 fL (ref 80.0–100.0)
NRBC: 0 % (ref 0.0–0.2)
Platelets: 342 10*3/uL (ref 150–400)
RBC: 4.54 MIL/uL (ref 3.87–5.11)
RDW: 13.2 % (ref 11.5–15.5)
WBC: 11.6 10*3/uL — AB (ref 4.0–10.5)

## 2017-11-02 LAB — BASIC METABOLIC PANEL
ANION GAP: 7 (ref 5–15)
BUN: 10 mg/dL (ref 6–20)
CHLORIDE: 105 mmol/L (ref 98–111)
CO2: 26 mmol/L (ref 22–32)
Calcium: 9.2 mg/dL (ref 8.9–10.3)
Creatinine, Ser: 0.73 mg/dL (ref 0.44–1.00)
GFR calc non Af Amer: >60 mL/min (ref >60)
Glucose, Bld: 120 mg/dL — ABNORMAL HIGH (ref 70–99)
POTASSIUM: 4.1 mmol/L (ref 3.5–5.1)
Sodium: 138 mmol/L (ref 135–145)

## 2017-11-02 LAB — URINALYSIS, COMPLETE (UACMP) WITH MICROSCOPIC
Bilirubin Urine: NEGATIVE
Glucose, UA: NEGATIVE mg/dL
Ketones, ur: NEGATIVE mg/dL
NITRITE: POSITIVE — AB
Protein, ur: NEGATIVE mg/dL
SPECIFIC GRAVITY, URINE: 1.014 (ref 1.005–1.030)
pH: 6 (ref 5.0–8.0)

## 2017-11-02 LAB — POCT PREGNANCY, URINE: Preg Test, Ur: NEGATIVE

## 2017-11-02 MED ORDER — ONDANSETRON HCL 4 MG PO TABS
4.0000 mg | ORAL_TABLET | Freq: Once | ORAL | Status: AC
  Administered 2017-11-02: 4 mg via ORAL
  Filled 2017-11-02: qty 1

## 2017-11-02 MED ORDER — FLUCONAZOLE 150 MG PO TABS: 150.0000 mg | ORAL_TABLET | Freq: Once | ORAL | 0 refills | Status: DC | PRN

## 2017-11-02 MED ORDER — OXYCODONE-ACETAMINOPHEN 5-325 MG PO TABS
1.0000 | ORAL_TABLET | Freq: Once | ORAL | Status: AC
Start: 2017-11-02 — End: 2017-11-02
  Administered 2017-11-02: 1 via ORAL
  Filled 2017-11-02: qty 1

## 2017-11-02 MED ORDER — CEPHALEXIN 500 MG PO CAPS
500.0000 mg | ORAL_CAPSULE | Freq: Once | ORAL | Status: AC
  Administered 2017-11-02: 500 mg via ORAL
  Filled 2017-11-02: qty 1

## 2017-11-02 MED ORDER — ONDANSETRON HCL 4 MG PO TABS
ORAL_TABLET | ORAL | Status: AC
  Filled 2017-11-02: qty 1

## 2017-11-02 MED ORDER — CEPHALEXIN 500 MG PO CAPS: 500.0000 mg | ORAL_CAPSULE | Freq: Three times a day (TID) | ORAL | 0 refills | Status: AC

## 2017-11-02 NOTE — ED Notes (Signed)
Pt c/o right flank pain that started around 10am this morning with nausea. Denies vomiting at present. States she has a hx of kidney stones and this feels like one. Pt is pacing in room. Skin is warm and dry.

## 2017-11-02 NOTE — ED Triage Notes (Signed)
Pt reports pain to right flank that started 2 hours ago, reports thinks she has a stone. Pt reports hx of stones and states it feels like one.

## 2017-11-02 NOTE — ED Provider Notes (Signed)
[facility] Emergency Department Provider Note  ___________________________________________   First MD Initiated Contact with Patient 11/02/17 1404     (approximate)  I have reviewed the triage vital signs and the nursing notes.   HISTORY  Chief Complaint Flank Pain and Nausea   HPI Molly Ellis is a 47 y.o. female history of kidney stones was presented to the emergency department with right lower flank pain that started suddenly at approximately 11 AM this morning.  She states that the pain is constant and has been increasing.  Associated with nausea.  Says that she has had difficulty urinating as well.  No urinary symptoms prior to the start of the pain this morning.  Says that if this feels similar to previous kidney stones.  Has passed all previous kidney stones on her own without any intervention and had her last stone approximate 1 year ago.  Denies any fever or chills.   Past Medical History:  Diagnosis Date  . Heart attack ([place]) 01/2014  . Kidney stones   . Kidney stones     Patient Active Problem List   Diagnosis Date Noted  . Chest pain of uncertain etiology 42/70/6237  . RBBB 12/10/2013  . Hypercholesterolemia 12/10/2013  . Migraine 12/10/2013  . Tobacco abuse 12/10/2013    Past Surgical History:  Procedure Laterality Date  . CARDIAC CATHETERIZATION    . CHOLECYSTECTOMY      Prior to Admission medications   Medication Sig Start Date End Date Taking? Authorizing Provider  aspirin EC 81 MG tablet Take 81 mg by mouth daily.    [provider]  Aspirin-Acetaminophen-Caffeine (GOODY HEADACHE PO) Take 1 packet by mouth daily as needed (for headaches).     [provider]  cetirizine (ZYRTEC) 10 MG tablet Take 10 mg by mouth daily.    [provider]  ibuprofen (ADVIL,MOTRIN) 600 MG tablet Take 1 tablet (600 mg total) by mouth 3 (three) times daily after meals. Patient not taking: Reported on 01/28/2017  04/04/16   Julianne Rice, MD  loratadine (CLARITIN) 10 MG tablet Take 10 mg by mouth every other day.    [provider]  methocarbamol (ROBAXIN) 500 MG tablet Take 2 tablets (1,000 mg total) by mouth every 8 (eight) hours as needed. Patient not taking: Reported on 01/28/2017 04/04/16   Julianne Rice, MD  PROAIR HFA 108 [phone] Base) MCG/ACT inhaler Inhale 1-2 puffs into the lungs See admin instructions. Every four to six hours as needed for shortness of breath or wheezing (seasonal allergies) 03/12/16   [provider]    Allergies Penicillins; Codeine; and Other  Family History  Problem Relation Age of Onset  . Asthma Mother   . Hypertension Mother   . Heart failure Maternal Grandfather   . Heart attack Maternal Grandfather     Social History Social History   Tobacco Use  . Smoking status: Former Research scientist (life sciences)  . Smokeless tobacco: Never Used  Substance Use Topics  . Alcohol use: Yes  . Drug use: No    Review of Systems  Constitutional: No fever/chills Eyes: No visual changes. ENT: No sore throat. Cardiovascular: Denies chest pain. Respiratory: Denies shortness of breath. Gastrointestinal: No abdominal pain.  no vomiting.  No diarrhea.  No constipation. Genitourinary: Negative for dysuria. Musculoskeletal: As above Skin: Negative for rash. Neurological: Negative for headaches, focal weakness or numbness.   ____________________________________________   PHYSICAL EXAM:  VITAL SIGNS: ED Triage Vitals  Enc Vitals Group  BP 11/02/17 1212 128/81     Pulse Rate 11/02/17 1212 86     Resp 11/02/17 1212 16     Temp 11/02/17 1212 98.2 F (36.8 C)     Temp Source 11/02/17 1212 Oral     SpO2 11/02/17 1212 97 %     Weight 11/02/17 1150 122 lb (55.3 kg)     Height 11/02/17 1150 5\' 6"  (1.676 m)     Head Circumference --      Peak Flow --      Pain Score 11/02/17 1150 8     Pain Loc --      Pain Edu? --      Excl. in [place]? --     Constitutional: Alert  and oriented.  Patient appears uncomfortable.  Pacing around the room. Eyes: Conjunctivae are normal.  Head: Atraumatic. Nose: No congestion/rhinnorhea. Mouth/Throat: Mucous membranes are moist.  Neck: No stridor.   Cardiovascular: Normal rate, regular rhythm. Grossly normal heart sounds.   Respiratory: Normal respiratory effort.  No retractions. Lungs CTAB. Gastrointestinal: Soft and nontender. No distention.  Positive for mild to moderate right-sided CVA tenderness to palpation. Musculoskeletal: No lower extremity tenderness nor edema.  No joint effusions. Neurologic:  Normal speech and language. No gross focal neurologic deficits are appreciated. Skin:  Skin is warm, dry and intact. No rash noted. Psychiatric: Mood and affect are normal. Speech and behavior are normal.  ____________________________________________   LABS (all labs ordered are listed, but only abnormal results are displayed)  Labs Reviewed  URINALYSIS, COMPLETE (UACMP) WITH MICROSCOPIC - Abnormal; Notable for the following components:      Result Value   Color, Urine YELLOW (*)    APPearance CLOUDY (*)    Hgb urine dipstick SMALL (*)    Nitrite POSITIVE (*)    Leukocytes, UA TRACE (*)    Bacteria, UA RARE (*)    All other components within normal limits  BASIC METABOLIC PANEL - Abnormal; Notable for the following components:   Glucose, Bld 120 (*)    All other components within normal limits  CBC - Abnormal; Notable for the following components:   WBC 11.6 (*)    All other components within normal limits   ____________________________________________  EKG   ____________________________________________  RADIOLOGY  CT the abdomen and pelvis without any kidney stones.  However, found to have a right-sided ovarian cyst.  3 cm simple appearing right-sided ovarian cyst.  No signs of torsion. ____________________________________________   PROCEDURES  Procedure(s) performed:   Procedures  Critical  Care performed:   ____________________________________________   INITIAL IMPRESSION / ASSESSMENT AND PLAN / ED COURSE  Pertinent labs & imaging results that were available during my care of the patient were reviewed by me and considered in my medical decision making (see chart for details).  Differential diagnosis includes, but is not limited to, ovarian cyst, ovarian torsion, acute appendicitis, diverticulitis, urinary tract infection/pyelonephritis, endometriosis, bowel obstruction, colitis, renal colic, gastroenteritis, hernia, fibroids, endometriosis, pregnancy related pain including ectopic pregnancy, etc. As part of my medical decision making, I reviewed the following data within the electronic MEDICAL RECORD NUMBER Notes from prior ED visits  ----------------------------------------- 3:36 PM on 11/02/2017 -----------------------------------------  Patient updated about CT results.  Says that she has had a history of ovarian cysts which required surgeries.  Denies any vaginal bleeding or discharge.  Says that she has already been tested negative for STDs 3 times this year.  Says that she has multiple sexual partners and is part  of the "kink community."  ----------------------------------------- 6:44 PM on 11/02/2017 -----------------------------------------  Patient resting comfortably at this time.  Updated about ovarian cyst.  Most likely pyelonephritis causing the patient's issues.  No signs of torsion on the ultrasound.  Also patient with constant pain and not coming going as would be more expected with intermittent torsion.  I will get follow-up with OB/GYN.  She will be discharged with Keflex.  She understands diagnosis as well as treatment and willing to comply. ____________________________________________   FINAL CLINICAL IMPRESSION(S) / ED DIAGNOSES  Ovarian cyst.  Pyelonephritis.  NEW MEDICATIONS STARTED DURING THIS VISIT:  New Prescriptions   No medications on file      Note:  This document was prepared using Dragon voice recognition software and may include unintentional dictation errors.     Orbie Pyo, MD 11/02/17 [phone]

## 2017-11-03 ENCOUNTER — Encounter: Payer: Self-pay | Admitting: *Deleted

## 2017-11-03 ENCOUNTER — Emergency Department
Admission: EM | Admit: 2017-11-03 | Discharge: 2017-11-03 | Disposition: A | Discharge status: Home / Self Care | Payer: Medicaid Other | Attending: Emergency Medicine | Admitting: Emergency Medicine

## 2017-11-03 ENCOUNTER — Other Ambulatory Visit: Payer: Self-pay

## 2017-11-03 DIAGNOSIS — Z7982 Long term (current) use of aspirin: Secondary | ICD-10-CM | POA: Diagnosis not present

## 2017-11-03 DIAGNOSIS — Z87891 Personal history of nicotine dependence: Secondary | ICD-10-CM | POA: Insufficient documentation

## 2017-11-03 DIAGNOSIS — Z79899 Other long term (current) drug therapy: Secondary | ICD-10-CM | POA: Insufficient documentation

## 2017-11-03 DIAGNOSIS — R1031 Right lower quadrant pain: Secondary | ICD-10-CM | POA: Diagnosis not present

## 2017-11-03 LAB — COMPREHENSIVE METABOLIC PANEL
ALK PHOS: 67 U/L (ref 38–126)
ALT: 18 U/L (ref 0–44)
ANION GAP: 10 (ref 5–15)
AST: 23 U/L (ref 15–41)
Albumin: 4.1 g/dL (ref 3.5–5.0)
BILIRUBIN TOTAL: 0.5 mg/dL (ref 0.3–1.2)
BUN: 13 mg/dL (ref 6–20)
CALCIUM: 9.4 mg/dL (ref 8.9–10.3)
CO2: 25 mmol/L (ref 22–32)
Chloride: 104 mmol/L (ref 98–111)
Creatinine, Ser: 0.79 mg/dL (ref 0.44–1.00)
GFR calc non Af Amer: >60 mL/min (ref >60)
Glucose, Bld: 101 mg/dL — ABNORMAL HIGH (ref 70–99)
Potassium: 4.1 mmol/L (ref 3.5–5.1)
Sodium: 139 mmol/L (ref 135–145)
TOTAL PROTEIN: 8 g/dL (ref 6.5–8.1)

## 2017-11-03 LAB — CBC
HCT: 42.5 % (ref 36.0–46.0)
Hemoglobin: 13.8 g/dL (ref 12.0–15.0)
MCH: 32.3 pg (ref 26.0–34.0)
MCHC: 32.5 g/dL (ref 30.0–36.0)
MCV: 99.5 fL (ref 80.0–100.0)
NRBC: 0 % (ref 0.0–0.2)
Platelets: 334 10*3/uL (ref 150–400)
RBC: 4.27 MIL/uL (ref 3.87–5.11)
RDW: 13.2 % (ref 11.5–15.5)
WBC: 10.7 10*3/uL — ABNORMAL HIGH (ref 4.0–10.5)

## 2017-11-03 MED ORDER — TRAMADOL HCL 50 MG PO TABS: 50.0000 mg | ORAL_TABLET | Freq: Four times a day (QID) | ORAL | 0 refills | Status: DC | PRN

## 2017-11-03 MED ORDER — TRAMADOL HCL 50 MG PO TABS
100.0000 mg | ORAL_TABLET | Freq: Once | ORAL | Status: AC
  Administered 2017-11-03: 100 mg via ORAL
  Filled 2017-11-03: qty 2

## 2017-11-03 MED ORDER — PROMETHAZINE HCL 25 MG PO TABS
25.0000 mg | ORAL_TABLET | Freq: Once | ORAL | Status: AC
  Administered 2017-11-03: 25 mg via ORAL
  Filled 2017-11-03: qty 1

## 2017-11-03 NOTE — ED Provider Notes (Signed)
[facility] Emergency Department Provider Note  Time seen: 6:16 PM  I have reviewed the triage vital signs and the nursing notes.   HISTORY  Chief Complaint Abdominal Pain    HPI Molly Ellis is a 47 y.o. female with a past medical history of kidney stones, presents to the emergency department for right lower quadrant abdominal pain.  This is a second interest the patient has been expensing right lower quadrant abdominal pain.  Patient was seen here yesterday in the emergency department had a largely negative work-up, besides a 3 cm ovarian cyst.  CT scan of the abdomen/pelvis was negative.  Lab work is reassuring.  Patient denies any fever.  States she was having vaginal spotting today but denies any discharge or fluid leakage.  She does state multiple sexual partners but states regular STD testing, and denies any symptoms of STDs.  Patient states she has an OB/GYN appointment tomorrow morning.  States she returned today because the pain worsened so she came to the emergency department.  She also states mild vaginal spotting which started after her pelvic ultrasound yesterday.   Past Medical History:  Diagnosis Date  . Heart attack ([place]) 01/2014  . Kidney stones   . Kidney stones     Patient Active Problem List   Diagnosis Date Noted  . Chest pain of uncertain etiology 28/31/5176  . RBBB 12/10/2013  . Hypercholesterolemia 12/10/2013  . Migraine 12/10/2013  . Tobacco abuse 12/10/2013    Past Surgical History:  Procedure Laterality Date  . CARDIAC CATHETERIZATION    . CHOLECYSTECTOMY      Prior to Admission medications   Medication Sig Start Date End Date Taking? Authorizing Provider  aspirin EC 81 MG tablet Take 81 mg by mouth daily.    [provider]  Aspirin-Acetaminophen-Caffeine (GOODY HEADACHE PO) Take 1 packet by mouth daily as needed (for headaches).     [provider]  cephALEXin (KEFLEX) 500 MG capsule Take 1 capsule  (500 mg total) by mouth 3 (three) times daily for 10 days. 11/02/17 11/12/17  Orbie Pyo, MD  cetirizine (ZYRTEC) 10 MG tablet Take 10 mg by mouth daily.    [provider]  fluconazole (DIFLUCAN) 150 MG tablet Take 1 tablet (150 mg total) by mouth once as needed for up to 1 dose (vaginal yeast infection). 11/02/17   Schaevitz, Randall An, MD  ibuprofen (ADVIL,MOTRIN) 600 MG tablet Take 1 tablet (600 mg total) by mouth 3 (three) times daily after meals. Patient not taking: Reported on 01/28/2017 04/04/16   Julianne Rice, MD  loratadine (CLARITIN) 10 MG tablet Take 10 mg by mouth every other day.    [provider]  methocarbamol (ROBAXIN) 500 MG tablet Take 2 tablets (1,000 mg total) by mouth every 8 (eight) hours as needed. Patient not taking: Reported on 01/28/2017 04/04/16   Julianne Rice, MD  PROAIR HFA 108 [phone] Base) MCG/ACT inhaler Inhale 1-2 puffs into the lungs See admin instructions. Every four to six hours as needed for shortness of breath or wheezing (seasonal allergies) 03/12/16   [provider]    Allergies  Allergen Reactions  . Penicillins Other (See Comments)    Has patient had a PCN reaction causing immediate rash, facial/tongue/throat swelling, SOB or lightheadedness with hypotension: Yes Has patient had a PCN reaction causing severe rash involving mucus membranes or skin necrosis: No Has patient had a PCN reaction that required hospitalization: Urgent care visit Has patient had a PCN reaction  occurring within the last 10 years: No If all of the above answers are "NO", then may proceed with Cephalosporin use.   . Codeine Itching    (Also) makes her skin feel like "it's crawling"  . Other Other (See Comments)    Narcotic pain meds: Would rather not take, as they cause constipation    Family History  Problem Relation Age of Onset  . Asthma Mother   . Hypertension Mother   . Heart failure Maternal Grandfather   . Heart attack  Maternal Grandfather     Social History Social History   Tobacco Use  . Smoking status: Former Research scientist (life sciences)  . Smokeless tobacco: Never Used  Substance Use Topics  . Alcohol use: Yes  . Drug use: No    Review of Systems Constitutional: Negative for fever. Cardiovascular: Negative for chest pain. Respiratory: Negative for shortness of breath. Gastrointestinal: Right lower quadrant abdominal pain.  Positive for nausea but negative for vomiting or diarrhea. Genitourinary: Negative for urinary compaints.  Negative for vaginal discharge.  Positive for vaginal spotting. Musculoskeletal: Negative for musculoskeletal complaints Skin: Negative for skin complaints  Neurological: Negative for headache All other ROS negative  ____________________________________________   PHYSICAL EXAM:  VITAL SIGNS: ED Triage Vitals  Enc Vitals Group     BP 11/03/17 1539 131/88     Pulse Rate 11/03/17 1539 77     Resp 11/03/17 1539 18     Temp 11/03/17 1539 98.3 F (36.8 C)     Temp Source 11/03/17 1539 Oral     SpO2 11/03/17 1539 99 %     Weight 11/03/17 1540 122 lb (55.3 kg)     Height 11/03/17 1540 5\' 6"  (1.676 m)     Head Circumference --      Peak Flow --      Pain Score 11/03/17 1540 7     Pain Loc --      Pain Edu? --      Excl. in [place]? --    Constitutional: Alert and oriented. Well appearing and in no distress. Eyes: Normal exam ENT   Head: Normocephalic and atraumatic.   Mouth/Throat: Mucous membranes are moist. Cardiovascular: Normal rate, regular rhythm. No murmur Respiratory: Normal respiratory effort without tachypnea nor retractions. Breath sounds are clear Gastrointestinal: Soft and nontender. No distention.   Musculoskeletal: Nontender with normal range of motion in all extremities. Neurologic:  Normal speech and language. No gross focal neurologic deficits Skin:  Skin is warm, dry and intact.  Psychiatric: Mood and affect are  normal.  ____________________________________________   INITIAL IMPRESSION / ASSESSMENT AND PLAN / ED COURSE  Pertinent labs & imaging results that were available during my care of the patient were reviewed by me and considered in my medical decision making (see chart for details).  Patient presents to the emergency department for right lower quadrant abdominal pain which has continued today.  I reviewed the patient's records she received a significant work-up yesterday for the same was ultimately diagnosed with urinary tract infection and discharged with Keflex.  Patient's white blood cell count today is 10.7 slightly less than yesterday.  Chemistry is reassuring.  Patient's urinalysis yesterday showed nitrite positive urine with 11-20 WBCs and was treated with Keflex.  Pregnancy test negative.  CT scan showed a 3 x 3.6 cm right ovarian cyst.  Ultrasound showed a 3 cm simple right ovarian cyst without signs of torsion.  Vitals are reassuring.  We will treat with tramadol and Phenergan.  Patient states  she will follow-up with OB/GYN tomorrow morning.  At this time with reassuring blood work reassuring vitals and a significant work-up yesterday including ultrasound and CT scan I believe the most appropriate plan for the patient would be OB/GYN follow-up tomorrow on a nonemergent basis.  I did discuss with the patient if the pain were to worsen significantly she should return to the emergency department.  Patient agreeable to plan of care.  I reviewed the patient's narcotic database information.  She has only had one controlled substance filled this year.    ____________________________________________   FINAL CLINICAL IMPRESSION(S) / ED DIAGNOSES  Right lower quadrant abdominal pain    Harvest Dark, MD 11/03/17 [phone]

## 2017-11-03 NOTE — Discharge Instructions (Signed)
Please follow-up with your OB/GYN at 9 AM as scheduled tomorrow morning.  Return to the emergency department for any significant worsening of abdominal pain or development of fever, or any other symptom personally concerning to yourself.  Please take your pain medication as needed, but only as prescribed.  You may also take over-the-counter Tylenol or ibuprofen as needed as written on the box.

## 2017-11-03 NOTE — ED Triage Notes (Signed)
Pt has right lower abd pain.  Pt reports she has ovarian cyst and is now cramping and bleeding.  Pt seen in er yesterday for kidney stone

## 2017-11-04 ENCOUNTER — Encounter: Payer: Self-pay | Admitting: Certified Nurse Midwife

## 2017-11-04 ENCOUNTER — Ambulatory Visit: Payer: Medicaid Other | Admitting: Certified Nurse Midwife

## 2017-11-04 VITALS — BP 86/52 | HR 60 | Ht 66.0 in | Wt 126.1 lb

## 2017-11-04 DIAGNOSIS — Z8742 Personal history of other diseases of the female genital tract: Secondary | ICD-10-CM

## 2017-11-04 DIAGNOSIS — N83201 Unspecified ovarian cyst, right side: Secondary | ICD-10-CM | POA: Diagnosis not present

## 2017-11-04 DIAGNOSIS — Z8349 Family history of other endocrine, nutritional and metabolic diseases: Secondary | ICD-10-CM | POA: Diagnosis not present

## 2017-11-04 DIAGNOSIS — Z9889 Other specified postprocedural states: Secondary | ICD-10-CM | POA: Diagnosis not present

## 2017-11-04 HISTORY — DX: Family history of other endocrine, nutritional and metabolic diseases: Z83.49

## 2017-11-04 HISTORY — DX: Other specified postprocedural states: Z98.890

## 2017-11-04 NOTE — Patient Instructions (Signed)
Ovarian Cyst An ovarian cyst is a fluid-filled sac that forms on an ovary. The ovaries are small organs that produce eggs in women. Various types of cysts can form on the ovaries. Some may cause symptoms and require treatment. Most ovarian cysts go away on their own, are not cancerous (are benign), and do not cause problems. Common types of ovarian cysts include:  Functional (follicle) cysts. ? Occur during the menstrual cycle, and usually go away with the next menstrual cycle if you do not get pregnant. ? Usually cause no symptoms.  Endometriomas. ? Are cysts that form from the tissue that lines the uterus (endometrium). ? Are sometimes called "chocolate cysts" because they become filled with blood that turns brown. ? Can cause pain in the lower abdomen during intercourse and during your period.  Cystadenoma cysts. ? Develop from cells on the outside surface of the ovary. ? Can get very large and cause lower abdomen pain and pain with intercourse. ? Can cause severe pain if they twist or break open (rupture).  Dermoid cysts. ? Are sometimes found in both ovaries. ? May contain different kinds of body tissue, such as skin, teeth, hair, or cartilage. ? Usually do not cause symptoms unless they get very big.  Theca lutein cysts. ? Occur when too much of a certain hormone (human chorionic gonadotropin) is produced and overstimulates the ovaries to produce an egg. ? Are most common after having procedures used to assist with the conception of a baby (in vitro fertilization).  What are the causes? Ovarian cysts may be caused by:  Ovarian hyperstimulation syndrome. This is a condition that can develop from taking fertility medicines. It causes multiple large ovarian cysts to form.  Polycystic ovarian syndrome (PCOS). This is a common hormonal disorder that can cause ovarian cysts, as well as problems with your period or fertility.  What increases the risk? The following factors may make  you more likely to develop ovarian cysts:  Being overweight or obese.  Taking fertility medicines.  Taking certain forms of hormonal birth control.  Smoking.  What are the signs or symptoms? Many ovarian cysts do not cause symptoms. If symptoms are present, they may include:  Pelvic pain or pressure.  Pain in the lower abdomen.  Pain during sex.  Abdominal swelling.  Abnormal menstrual periods.  Increasing pain with menstrual periods.  How is this diagnosed? These cysts are commonly found during a routine pelvic exam. You may have tests to find out more about the cyst, such as:  Ultrasound.  X-ray of the pelvis.  CT scan.  MRI.  Blood tests.  How is this treated? Many ovarian cysts go away on their own without treatment. Your health care provider may want to check your cyst regularly for 2-3 months to see if it changes. If you are in menopause, it is especially important to have your cyst monitored closely because menopausal women have a higher rate of ovarian cancer. When treatment is needed, it may include:  Medicines to help relieve pain.  A procedure to drain the cyst (aspiration).  Surgery to remove the whole cyst.  Hormone treatment or birth control pills. These methods are sometimes used to help dissolve a cyst.  Follow these instructions at home:  Take over-the-counter and prescription medicines only as told by your health care provider.  Do not drive or use heavy machinery while taking prescription pain medicine.  Get regular pelvic exams and Pap tests as often as told by your health care   provider.  Return to your normal activities as told by your health care provider. Ask your health care provider what activities are safe for you.  Do not use any products that contain nicotine or tobacco, such as cigarettes and e-cigarettes. If you need help quitting, ask your health care provider.  Keep all follow-up visits as told by your health care provider.  This is important. Contact a health care provider if:  Your periods are late, irregular, or painful, or they stop.  You have pelvic pain that does not go away.  You have pressure on your bladder or trouble emptying your bladder completely.  You have pain during sex.  You have any of the following in your abdomen: ? A feeling of fullness. ? Pressure. ? Discomfort. ? Pain that does not go away. ? Swelling.  You feel generally ill.  You become constipated.  You lose your appetite.  You develop severe acne.  You start to have more body hair and facial hair.  You are gaining weight or losing weight without changing your exercise and eating habits.  You think you may be pregnant. Get help right away if:  You have abdominal pain that is severe or gets worse.  You cannot eat or drink without vomiting.  You suddenly develop a fever.  Your menstrual period is much heavier than usual. This information is not intended to replace advice given to you by your health care provider. Make sure you discuss any questions you have with your health care provider. Document Released: 01/04/2005 Document Revised: 07/25/2015 Document Reviewed: 06/08/2015 Elsevier Interactive Patient Education  2018 [employer]  

## 2017-11-04 NOTE — Progress Notes (Signed)
GYN ENCOUNTER NOTE  Subjective:       Molly Ellis is a 47 y.o. [id number] female is here for gynecologic evaluation of the following issues:  1. Right ovarian cyst.    Patient seen at [facility] ER on 11/02/2017 for kidney stones. During CT scan ovarian cyst was visualized. Endorses intermittent right lower quadrant soreness, but symptoms are better since ER visit.   Denies difficulty breathing or respiratory distress, chest pain, abdominal pain, vaginal bleeding, dysuria, and leg pain or swelling.   History significant for abnormal uterine bleeding necessitating an ablation (approximately six [6] years ago), kidney stones, uterine cysts requiring removal (approximately eight [8] years ago).    Gynecologic History  No LMP recorded. Patient has had an ablation.   Contraception: Bilateral tubal ligation  Last Pap: 2016. Results were: normal  Last mammogram: 2016. Results were: normal  Obstetric History OB History  Gravida Para Term Preterm AB Living  10 3 3  0 7 2  SAB TAB Ectopic Multiple Live Births  7       2    # Outcome Date GA Lbr Len/2nd Weight Sex Delivery Anes PTL Lv  10 Term           9 SAB           8 SAB           7 SAB           6 SAB           5 SAB           4 SAB           3 SAB           2 Term           1 Term             Past Medical History:  Diagnosis Date  . Heart attack ([place]) 01/2014  . Kidney stones     Past Surgical History:  Procedure Laterality Date  . CARDIAC CATHETERIZATION    . CHOLECYSTECTOMY    . ENDOMETRIAL ABLATION    . LAPAROSCOPIC OVARIAN CYSTECTOMY    . TUBAL LIGATION      Current Outpatient Medications on File Prior to Visit  Medication Sig Dispense Refill  . cephALEXin (KEFLEX) 500 MG capsule Take 1 capsule (500 mg total) by mouth 3 (three) times daily for 10 days. 30 capsule 0  . cetirizine (ZYRTEC) 10 MG tablet Take 10 mg by mouth daily.    . fluconazole (DIFLUCAN) 150 MG tablet Take 1 tablet (150 mg total) by mouth  once as needed for up to 1 dose (vaginal yeast infection). 1 tablet 0  . traMADol (ULTRAM) 50 MG tablet Take 1 tablet (50 mg total) by mouth every 6 (six) hours as needed. 10 tablet 0   No current facility-administered medications on file prior to visit.     Allergies  Allergen Reactions  . Penicillins Other (See Comments)    Has patient had a PCN reaction causing immediate rash, facial/tongue/throat swelling, SOB or lightheadedness with hypotension: Yes Has patient had a PCN reaction causing severe rash involving mucus membranes or skin necrosis: No Has patient had a PCN reaction that required hospitalization: Urgent care visit Has patient had a PCN reaction occurring within the last 10 years: No If all of the above answers are "NO", then may proceed with Cephalosporin use.   . Codeine Itching    (Also) makes her skin  feel like "it's crawling"  . Other Other (See Comments)    Narcotic pain meds: Would rather not take, as they cause constipation    Social History   Socioeconomic History  . Marital status: Single    Spouse name: Not on file  . Number of children: Not on file  . Years of education: Not on file  . Highest education level: Not on file  Occupational History  . Not on file  Social Needs  . Financial resource strain: Not on file  . Food insecurity:    Worry: Not on file    Inability: Not on file  . Transportation needs:    Medical: Not on file    Non-medical: Not on file  Tobacco Use  . Smoking status: Current Some Day Smoker    Packs/day: 0.00    Types: Cigarettes  . Smokeless tobacco: Never Used  Substance and Sexual Activity  . Alcohol use: Yes    Alcohol/week: 1.0 standard drinks    Types: 1 Cans of beer per week  . Drug use: No  . Sexual activity: Yes  Lifestyle  . Physical activity:    Days per week: Not on file    Minutes per session: Not on file  . Stress: Not on file  Relationships  . Social connections:    Talks on phone: Not on file     Gets together: Not on file    Attends religious service: Not on file    Active member of club or organization: Not on file    Attends meetings of clubs or organizations: Not on file    Relationship status: Not on file  . Intimate partner violence:    Fear of current or ex partner: Not on file    Emotionally abused: Not on file    Physically abused: Not on file    Forced sexual activity: Not on file  Other Topics Concern  . Not on file  Social History Narrative  . Not on file    Family History  Problem Relation Age of Onset  . Asthma Mother   . Hypertension Mother   . Heart failure Maternal Grandfather   . Heart attack Maternal Grandfather   . Prostate cancer Maternal Uncle   . Breast cancer Neg Hx   . Ovarian cancer Neg Hx   . Colon cancer Neg Hx     The following portions of the patient's history were reviewed and updated as appropriate: allergies, current medications, past family history, past medical history, past social history, past surgical history and problem list.  Review of Systems  ROS negative except as noted above. Information obtained from patient.   Objective:   BP (!) 86/52   Pulse 60   Ht 5\' 6"  (1.676 m)   Wt 126 lb 1.6 oz (57.2 kg)   BMI 20.35 kg/m    CONSTITUTIONAL: Well-developed, well-nourished female in no acute distress.   [place]: Alert and oriented to person, place, and time.   PSYCHIATRIC: Normal mood and affect. Normal behavior. Normal judgment and thought content.  CARDIOVASCULAR:   RESPIRATORY: Normal respiratory effort, chest expands symmetrically. Lungs are clear to auscultation, no crackles or wheezes.  ABDOMEN: Soft, non distended; tenderness with palpation to right lower quadrant.  No Organomegaly.   EXAM: TRANSABDOMINAL AND TRANSVAGINAL ULTRASOUND OF PELVIS AND DOPPLER ULTRASOUND OF OVARIES [facility] ER, 11/02/2017)  CLINICAL DATA:  Initial evaluation for acute pelvic pain. Ovarian cyst seen on prior CT.  TECHNIQUE: Both  transabdominal and transvaginal  ultrasound examinations of the pelvis were performed. Transabdominal technique was performed for global imaging of the pelvis including uterus, ovaries, adnexal regions, and pelvic cul-de-sac.  It was necessary to proceed with endovaginal exam following the transabdominal exam to visualize the uterus, endometrium, and ovaries. Color and duplex Doppler ultrasound was utilized to evaluate blood flow to the ovaries.  COMPARISON:  Prior CT from earlier the same day.  FINDINGS: Uterus  Measurements: 7.4 x 3.1 x 4.4 cm. 9 x 6 x 6 mm cyst noted at the central uterine fundus, indeterminate, but possibly reflecting a small amount of trapped fluid.  Endometrium  Thickness: 6 mm.  No focal abnormality visualized.  Right ovary  Measurements: 3.7 x 3.1 x 3.6 cm. 3.0 x 2.9 x 2.8 cm simple right renal cyst. No internal vascularity or complexity.  Left ovary  Measurements: 2.2 x 1.2 x 2.1 cm. Normal appearance/no adnexal mass.  Pulsed Doppler evaluation of both ovaries demonstrates normal low-resistance arterial and venous waveforms.  Other findings  No abnormal free fluid.  IMPRESSION: 1. 3 cm simple right ovarian cyst. This is almost certainly benign, and no specific imaging follow up is recommended according to the Society of Radiologists in [place] (D Clovis Riley et al. Management of Asymptomatic Ovarian and Other Adnexal Cysts Imaged at [place]: Society of Radiologists in [place] 2010. Radiology 256 (Sept 2010): 943-954.). 2. No other acute abnormality within the pelvis. No evidence for torsion.   Electronically Signed   By: Jeannine Boga M.D.   On: 11/02/2017 18:33  Assessment:   1. Right ovarian cyst   2. History of endometrial ablation   3. History of removal of ovarian cyst   4. History of abnormal uterine bleeding   5. Family history of  Graves' disease   Plan:   Ultrasound findings reviewed with patient.   Discussed cyst management options.   Reviewed red flag symptoms and when to call.   RTC x 4-6 weeks for ultrasound and ANNUAL EXAM or sooner if needed.    Diona Fanti, CNM [facility], [facility]

## 2017-11-05 LAB — URINE CULTURE

## 2017-11-22 ENCOUNTER — Other Ambulatory Visit: Payer: Self-pay | Admitting: Certified Nurse Midwife

## 2017-11-22 DIAGNOSIS — Z8742 Personal history of other diseases of the female genital tract: Secondary | ICD-10-CM

## 2017-12-01 ENCOUNTER — Other Ambulatory Visit (HOSPITAL_COMMUNITY)
Admission: RE | Admit: 2017-12-01 | Discharge: 2017-12-01 | Disposition: A | Discharge status: Home / Self Care | Payer: Medicaid Other | Source: Ambulatory Visit | Attending: Certified Nurse Midwife | Admitting: Certified Nurse Midwife

## 2017-12-01 ENCOUNTER — Ambulatory Visit (INDEPENDENT_AMBULATORY_CARE_PROVIDER_SITE_OTHER): Payer: Medicaid Other

## 2017-12-01 ENCOUNTER — Ambulatory Visit: Payer: Medicaid Other | Admitting: Certified Nurse Midwife

## 2017-12-01 ENCOUNTER — Encounter: Payer: Self-pay | Admitting: Certified Nurse Midwife

## 2017-12-01 VITALS — BP 110/65 | HR 58 | Ht 66.0 in | Wt 126.8 lb

## 2017-12-01 DIAGNOSIS — D259 Leiomyoma of uterus, unspecified: Secondary | ICD-10-CM | POA: Diagnosis not present

## 2017-12-01 DIAGNOSIS — Z124 Encounter for screening for malignant neoplasm of cervix: Secondary | ICD-10-CM

## 2017-12-01 DIAGNOSIS — Z1231 Encounter for screening mammogram for malignant neoplasm of breast: Secondary | ICD-10-CM

## 2017-12-01 DIAGNOSIS — D251 Intramural leiomyoma of uterus: Secondary | ICD-10-CM | POA: Diagnosis not present

## 2017-12-01 DIAGNOSIS — N83202 Unspecified ovarian cyst, left side: Secondary | ICD-10-CM

## 2017-12-01 DIAGNOSIS — Z01419 Encounter for gynecological examination (general) (routine) without abnormal findings: Secondary | ICD-10-CM | POA: Diagnosis present

## 2017-12-01 DIAGNOSIS — Z8742 Personal history of other diseases of the female genital tract: Secondary | ICD-10-CM

## 2017-12-01 DIAGNOSIS — N83201 Unspecified ovarian cyst, right side: Secondary | ICD-10-CM | POA: Diagnosis not present

## 2017-12-01 DIAGNOSIS — Z113 Encounter for screening for infections with a predominantly sexual mode of transmission: Secondary | ICD-10-CM | POA: Diagnosis present

## 2017-12-01 DIAGNOSIS — R102 Pelvic and perineal pain unspecified side: Secondary | ICD-10-CM

## 2017-12-01 DIAGNOSIS — Z0000 Encounter for general adult medical examination without abnormal findings: Secondary | ICD-10-CM

## 2017-12-01 NOTE — Patient Instructions (Signed)
Ovarian Cyst An ovarian cyst is a fluid-filled sac on an ovary. The ovaries are organs that make eggs in women. Most ovarian cysts go away on their own and are not cancerous (are benign). Some cysts need treatment. Follow these instructions at home:  Take over-the-counter and prescription medicines only as told by your doctor.  Do not drive or use heavy machinery while taking prescription pain medicine.  Get pelvic exams and Pap tests as often as told by your doctor.  Return to your normal activities as told by your doctor. Ask your doctor what activities are safe for you.  Do not use any products that contain nicotine or tobacco, such as cigarettes and e-cigarettes. If you need help quitting, ask your doctor.  Keep all follow-up visits as told by your doctor. This is important. Contact a doctor if:  Your periods are: ? Late. ? Irregular. ? Painful.  Your periods stop.  You have pelvic pain that does not go away.  You have pressure on your bladder.  You have trouble making your bladder empty when you pee (urinate).  You have pain during sex.  You have any of the following in your belly (abdomen): ? A feeling of fullness. ? Pressure. ? Discomfort. ? Pain that does not go away. ? Swelling.  You feel sick most of the time.  You have trouble pooping (have constipation).  You are not as hungry as usual (you lose your appetite).  You get very bad acne.  You start to have more hair on your body and face.  You are gaining weight or losing weight without changing your exercise and eating habits.  You think you may be pregnant. Get help right away if:  You have belly pain that is very bad or gets worse.  You cannot eat or drink without throwing up (vomiting).  You suddenly get a fever.  Your period is a lot heavier than usual. This information is not intended to replace advice given to you by your health care provider. Make sure you discuss any questions you have  with your health care provider. Document Released: 06/23/2007 Document Revised: 07/25/2015 Document Reviewed: 06/08/2015 Elsevier Interactive Patient Education  2018 Else[employer] Inc. Uterine Fibroids Uterine fibroids are tissue masses (tumors). They are also called leiomyomas. They can develop inside of a woman's womb (uterus). They can grow very large. Fibroids are not cancerous (benign). Most fibroids do not require medical treatment. Follow these instructions at home:  Keep all follow-up visits as told by your doctor. This is important.  Take medicines only as told by your doctor. ? If you were prescribed a hormone treatment, take the hormone medicines exactly as told. ? Do not take aspirin. It can cause bleeding.  Ask your doctor about taking iron pills and increasing the amount of dark green, leafy vegetables in your diet. These actions can help to boost your blood iron levels.  Pay close attention to your period. Tell your doctor about any changes, such as: ? Increased blood flow. This may require you to use more pads or tampons than usual per month. ? A change in the number of days that your period lasts per month. ? A change in symptoms that come with your period, such as back pain or cramping in your belly area (abdomen). Contact a doctor if:  You have pain in your back or the area between your hip bones (pelvic area) that is not controlled by medicines.  You have pain in your abdomen that   is not controlled with medicines.  You have an increase in bleeding between and during periods.  You soak tampons or pads in a half hour or less.  You feel lightheaded.  You feel extra tired.  You feel weak. Get help right away if:  You pass out (faint).  You have a sudden increase in pelvic pain. This information is not intended to replace advice given to you by your health care provider. Make sure you discuss any questions you have with your health care provider. Document Released:  02/06/2010 Document Revised: 09/05/2015 Document Reviewed: 07/03/2013 Elsevier Interactive Patient Education  2018 [place] Years, Female Preventive care refers to lifestyle choices and visits with your health care provider that can promote health and wellness. What does preventive care include?  A yearly physical exam. This is also called an annual well check.  Dental exams once or twice a year.  Routine eye exams. Ask your health care provider how often you should have your eyes checked.  Personal lifestyle choices, including: ? Daily care of your teeth and gums. ? Regular physical activity. ? Eating a healthy diet. ? Avoiding tobacco and drug use. ? Limiting alcohol use. ? Practicing safe sex. ? Taking low-dose aspirin daily starting at age 46. ? Taking vitamin and mineral supplements as recommended by your health care provider. What happens during an annual well check? The services and screenings done by your health care provider during your annual well check will depend on your age, overall health, lifestyle risk factors, and family history of disease. Counseling Your health care provider may ask you questions about your:  Alcohol use.  Tobacco use.  Drug use.  Emotional well-being.  Home and relationship well-being.  Sexual activity.  Eating habits.  Work and work Statistician.  Method of birth control.  Menstrual cycle.  Pregnancy history.  Screening You may have the following tests or measurements:  Height, weight, and BMI.  Blood pressure.  Lipid and cholesterol levels. These may be checked every 5 years, or more frequently if you are over 31 years old.  Skin check.  Lung cancer screening. You may have this screening every year starting at age 48 if you have a 30-pack-year history of smoking and currently smoke or have quit within the past 15 years.  Fecal occult blood test (FOBT) of the stool. You may have this test  every year starting at age 59.  Flexible sigmoidoscopy or colonoscopy. You may have a sigmoidoscopy every 5 years or a colonoscopy every 10 years starting at age 72.  Hepatitis C blood test.  Hepatitis B blood test.  Sexually transmitted disease (STD) testing.  Diabetes screening. This is done by checking your blood sugar (glucose) after you have not eaten for a while (fasting). You may have this done every 1-3 years.  Mammogram. This may be done every 1-2 years. Talk to your health care provider about when you should start having regular mammograms. This may depend on whether you have a family history of breast cancer.  BRCA-related cancer screening. This may be done if you have a family history of breast, ovarian, tubal, or peritoneal cancers.  Pelvic exam and Pap test. This may be done every 3 years starting at age 14. Starting at age 95, this may be done every 5 years if you have a Pap test in combination with an HPV test.  Bone density scan. This is done to screen for osteoporosis. You may have this scan if you  are at high risk for osteoporosis.  Discuss your test results, treatment options, and if necessary, the need for more tests with your health care provider. Vaccines Your health care provider may recommend certain vaccines, such as:  Influenza vaccine. This is recommended every year.  Tetanus, diphtheria, and acellular pertussis (Tdap, Td) vaccine. You may need a Td booster every 10 years.  Varicella vaccine. You may need this if you have not been vaccinated.  Zoster vaccine. You may need this after age 60.  Measles, mumps, and rubella (MMR) vaccine. You may need at least one dose of MMR if you were born in 1957 or later. You may also need a second dose.  Pneumococcal 13-valent conjugate (PCV13) vaccine. You may need this if you have certain conditions and were not previously vaccinated.  Pneumococcal polysaccharide (PPSV23) vaccine. You may need one or two doses if you  smoke cigarettes or if you have certain conditions.  Meningococcal vaccine. You may need this if you have certain conditions.  Hepatitis A vaccine. You may need this if you have certain conditions or if you travel or work in places where you may be exposed to hepatitis A.  Hepatitis B vaccine. You may need this if you have certain conditions or if you travel or work in places where you may be exposed to hepatitis B.  Haemophilus influenzae type b (Hib) vaccine. You may need this if you have certain conditions.  Talk to your health care provider about which screenings and vaccines you need and how often you need them. This information is not intended to replace advice given to you by your health care provider. Make sure you discuss any questions you have with your health care provider. Document Released: 01/31/2015 Document Revised: 09/24/2015 Document Reviewed: 11/05/2014 Elsevier Interactive Patient Education  2018 [employer]vier Inc.  

## 2017-12-01 NOTE — Progress Notes (Signed)
ANNUAL PREVENTATIVE CARE GYN  ENCOUNTER NOTE  Subjective:       Molly Ellis is a 47 y.o. [id number] female here for a routine annual gynecologic exam.  Current complaints: 1. Intermittent pelvic pain 2. Needs Pap smear 3. History of ovarian cyst 4. Requests STI screening  Denies difficulty breathing or respiratory distress, chest pain, abdominal pain, vaginal bleeding, dysuria, and leg pain or swelling.    Gynecologic History  No LMP recorded. Patient has had an ablation.   Contraception: bilateral tubal ligation   Last Pap: 2016. Results were: normal  Last mammogram: 2016. Results were: normal  Obstetric History  OB History  Gravida Para Term Preterm AB Living  10 3 3  0 7 2  SAB TAB Ectopic Multiple Live Births  7       2    # Outcome Date GA Lbr Len/2nd Weight Sex Delivery Anes PTL Lv  10 Term           9 SAB           8 SAB           7 SAB           6 SAB           5 SAB           4 SAB           3 SAB           2 Term           1 Term             Past Medical History:  Diagnosis Date  . Heart attack ([place]) 01/2014  . Kidney stones   . Kidney stones     Past Surgical History:  Procedure Laterality Date  . CARDIAC CATHETERIZATION    . CHOLECYSTECTOMY    . ENDOMETRIAL ABLATION    . LAPAROSCOPIC OVARIAN CYSTECTOMY    . TUBAL LIGATION      Current Outpatient Medications on File Prior to Visit  Medication Sig Dispense Refill  . cetirizine (ZYRTEC) 10 MG tablet Take 10 mg by mouth daily.     No current facility-administered medications on file prior to visit.     Allergies  Allergen Reactions  . Penicillins Other (See Comments)    Has patient had a PCN reaction causing immediate rash, facial/tongue/throat swelling, SOB or lightheadedness with hypotension: Yes Has patient had a PCN reaction causing severe rash involving mucus membranes or skin necrosis: No Has patient had a PCN reaction that required hospitalization: Urgent care visit Has  patient had a PCN reaction occurring within the last 10 years: No If all of the above answers are "NO", then may proceed with Cephalosporin use.   . Codeine Itching    (Also) makes her skin feel like "it's crawling"  . Other Other (See Comments)    Narcotic pain meds: Would rather not take, as they cause constipation    Social History   Socioeconomic History  . Marital status: Single    Spouse name: Not on file  . Number of children: Not on file  . Years of education: Not on file  . Highest education level: Not on file  Occupational History  . Not on file  Social Needs  . Financial resource strain: Not on file  . Food insecurity:    Worry: Not on file    Inability: Not on file  . Transportation needs:    Medical:  Not on file    Non-medical: Not on file  Tobacco Use  . Smoking status: Current Some Day Smoker    Packs/day: 0.00    Types: Cigarettes  . Smokeless tobacco: Never Used  Substance and Sexual Activity  . Alcohol use: Yes    Alcohol/week: 1.0 standard drinks    Types: 1 Cans of beer per week  . Drug use: No  . Sexual activity: Yes    Birth control/protection: Surgical  Lifestyle  . Physical activity:    Days per week: Not on file    Minutes per session: Not on file  . Stress: Not on file  Relationships  . Social connections:    Talks on phone: Not on file    Gets together: Not on file    Attends religious service: Not on file    Active member of club or organization: Not on file    Attends meetings of clubs or organizations: Not on file    Relationship status: Not on file  . Intimate partner violence:    Fear of current or ex partner: Not on file    Emotionally abused: Not on file    Physically abused: Not on file    Forced sexual activity: Not on file  Other Topics Concern  . Not on file  Social History Narrative  . Not on file    Family History  Problem Relation Age of Onset  . Asthma Mother   . Hypertension Mother   . Heart failure Maternal  Grandfather   . Heart attack Maternal Grandfather   . Prostate cancer Maternal Uncle   . Breast cancer Neg Hx   . Ovarian cancer Neg Hx   . Colon cancer Neg Hx     The following portions of the patient's history were reviewed and updated as appropriate: allergies, current medications, past family history, past medical history, past social history, past surgical history and problem list.  Review of Systems  ROS negative except as noted above. Information obtained from patient.    Objective:   BP 110/65   Pulse (!) 58   Ht 5\' 6"  (1.676 m)   Wt 126 lb 12.8 oz (57.5 kg)   BMI 20.47 kg/m    CONSTITUTIONAL: Well-developed, well-nourished female in no acute distress.   PSYCHIATRIC: Normal mood and affect. Normal behavior. Normal judgment and thought content.  [place]: Alert and oriented to person, place, and time. Normal muscle tone coordination. No cranial nerve deficit noted.  HENT:  Normocephalic, atraumatic, External right and left ear normal. Oropharynx is clear and moist  EYES: Conjunctivae and EOM are normal. Pupils are equal and round.   NECK: Normal range of motion, supple, no masses.  Normal thyroid.   SKIN: Skin is warm and dry. No rash noted. Not diaphoretic. No  erythema. No pallor.  CARDIOVASCULAR: Normal heart rate noted, regular rhythm, no murmur.  RESPIRATORY: Clear to auscultation bilaterally. Effort and breath sounds normal, no problems with respiration noted.  BREASTS: Symmetric in size. No masses, skin changes, nipple drainage, or lymphadenopathy.  ABDOMEN: Soft, normal bowel sounds, no distention noted.  No tenderness, rebound or guarding.   PELVIC:  External Genitalia: Normal  Vagina: Normal  Cervix: Normal  Uterus: Normal  Adnexa: Normal   MUSCULOSKELETAL: Normal range of motion. No tenderness.  No cyanosis, clubbing, or edema.  2+ distal pulses.  LYMPHATIC: No Axillary, Supraclavicular, or Inguinal Adenopathy.  Patient Name: Molly Ellis DOB: [birth year] MRN: [id number]  ULTRASOUND REPORT  Location:  [facility] Date of Service: 12/01/2017     Indications:Ovarian cyst Findings:  The uterus is anteverted and measures 6.8x3.3x4.3cm. Echo texture is heterogenous with evidence of focal masses. Within the uterus are multiple suspected fibroids measuring: Fibroid 1:IM , Rt posterior uterine wall = 2x1.8x1.9 whic has a simple cyst = 0.7x0.5cm Fibroid 2:IM, Lt posterior uterine wall = 1.2x1cm  The Endometrium measures 4.8 mm.Appears irregular (ablation)  Right Ovary measures 13.7 cm3. It has a simple cyst = 2.7x1.9cm Left Ovary measures 16.5 cm3. It has a simple cyst = 2.9x2.9cm Survey of the adnexa demonstrates no adnexal masses. There is no free fluid in the cul de sac.  Impression: 1. Within the uterus are multiple suspected fibroids measuring: Fibroid 1:IM , Rt posterior uterine wall = 2x1.8x1.9 whic has a simple cyst = 0.7x0.5cm Fibroid 2:IM, Lt posterior uterine wall = 1.2x1cm 2. Right Ovary has a simple cyst = 2.7x1.9cm Left Ovary  has a simple cyst = 2.9x2.9cm   Recommendations: 1.Clinical correlation with the patient's History and Physical Exam.  Assessment:   Annual gynecologic examination 47 y.o.   Contraception: bilateral tubal ligation   Normal BMI   Problem List Items Addressed This Visit    None    Visit Diagnoses    Well woman exam    -  Primary   Relevant Orders   FSH/LH   CBC   RPR   HIV Antibody (routine testing w rflx)   Cytology - PAP   Screening for cervical cancer       Relevant Orders   Cytology - PAP   History of ovarian cyst       Relevant Orders   FSH/LH   Uterine leiomyoma, unspecified location       Screen for STD (sexually transmitted disease)       Relevant Orders   RPR   HIV Antibody (routine testing w rflx)   Cytology - PAP   Pelvic pain          Plan:   Pap: Pap Co Test and GC/CT NAAT  Mammogram: Ordered  Labs: See orders   Routine  preventative health maintenance measures emphasized: Exercise/Diet/Weight control, Tobacco Warnings, Alcohol/Substance use risks, Stress Management, Peer Pressure Issues and Safe Sex; see AVS  Encouraged symptom tracking  Ultrasound findings reviewed with patient, verbalized understanding  Discussed cyst and fibroid management option; see AVS  Reviewed red flag symptoms and when to call  RTC x 3 months for follow up ultrasound  RTC x 1 year for ANNUAL EXAM or sooner if needed   Diona Fanti, CNM [facility], [facility]

## 2017-12-01 NOTE — Progress Notes (Signed)
Patient here for AE and to discuss U/S results, c/o intermittent lower pelvic pain.

## 2017-12-02 LAB — CBC
HEMOGLOBIN: 14.1 g/dL (ref 11.1–15.9)
Hematocrit: 40.9 % (ref 34.0–46.6)
MCH: 32.2 pg (ref 26.6–33.0)
MCHC: 34.5 g/dL (ref 31.5–35.7)
MCV: 93 fL (ref 79–97)
PLATELETS: 347 10*3/uL (ref 150–450)
RBC: 4.38 x10E6/uL (ref 3.77–5.28)
RDW: 12.2 % — ABNORMAL LOW (ref 12.3–15.4)
WBC: 10.3 10*3/uL (ref 3.4–10.8)

## 2017-12-02 LAB — RPR: RPR: NONREACTIVE

## 2017-12-02 LAB — FSH/LH
FSH: 7.7 m[IU]/mL
LH: 6.1 m[IU]/mL

## 2017-12-02 LAB — HIV ANTIBODY (ROUTINE TESTING W REFLEX): HIV SCREEN 4TH GENERATION: NONREACTIVE

## 2017-12-07 LAB — CYTOLOGY - PAP
Bacterial vaginitis: POSITIVE — AB
CHLAMYDIA, DNA PROBE: NEGATIVE
Candida vaginitis: NEGATIVE
DIAGNOSIS: NEGATIVE
HPV 16/18/45 genotyping: NEGATIVE
HPV: DETECTED — AB
Neisseria Gonorrhea: NEGATIVE
TRICH (WINDOWPATH): NEGATIVE

## 2017-12-12 ENCOUNTER — Encounter: Payer: Self-pay | Admitting: Certified Nurse Midwife

## 2017-12-12 ENCOUNTER — Other Ambulatory Visit: Payer: Self-pay

## 2017-12-12 DIAGNOSIS — R8782 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: Secondary | ICD-10-CM | POA: Insufficient documentation

## 2017-12-12 HISTORY — DX: 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: R87.820

## 2017-12-12 MED ORDER — METRONIDAZOLE 500 MG PO TABS: 500.0000 mg | ORAL_TABLET | Freq: Two times a day (BID) | ORAL | 0 refills | Status: DC

## 2017-12-12 NOTE — Progress Notes (Signed)
Please contact patient. Labs normal. Pap negative for intraepithelial lesion and malignancy or normal. 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 Recommendations are to repeat Pap with co-testing in a year. Signs of bacterial vaginosis on Pap smear may have flagyl, metrogel, or solosec for treatment if desired. Encourage to activate MyChart. Thanks, JML

## 2018-02-09 ENCOUNTER — Encounter: Payer: Self-pay | Admitting: Certified Nurse Midwife

## 2018-02-09 ENCOUNTER — Other Ambulatory Visit: Payer: Medicaid Other

## 2018-02-09 ENCOUNTER — Ambulatory Visit: Payer: Medicaid Other | Admitting: Certified Nurse Midwife

## 2018-02-09 VITALS — BP 110/70 | HR 55 | Ht 66.0 in | Wt 123.1 lb

## 2018-02-09 DIAGNOSIS — R102 Pelvic and perineal pain: Secondary | ICD-10-CM

## 2018-02-09 NOTE — Progress Notes (Signed)
Patient here to discuss ultrasound done 11/14, c/o still having intermittent right side pelvic pain.

## 2018-02-09 NOTE — Progress Notes (Signed)
GYN ENCOUNTER NOTE  Subjective:       Molly Ellis is a 48 y.o. [id number] female here for follow up regarding pelvic pain.   Last seen in office on 12/01/2017 for symptoms. Notes pain has not increased and remains intermittent. First notices when waking for sleeping and stretching. Relieved by Advil and movement.   Denies difficulty breathing or respiratory distress, chest pain, abdominal pain, vaginal bleeding, dysuria, and leg pain or swelling.    Gynecologic History  No LMP recorded. Patient has had an ablation.  Contraception: tubal ligation  Last Pap: 12/01/2017. Results were: Negative/Positive/Negative 16, 18/45  Last mammogram: 2016. Results were: normal  Obstetric History  OB History  Gravida Para Term Preterm AB Living  10 3 0 3 7 2   SAB TAB Ectopic Multiple Live Births  7       2    # Outcome Date GA Lbr Len/2nd Weight Sex Delivery Anes PTL Lv  10 SAB 01/18/01          9 Preterm 03/14/00   4 lb 8 oz (2.041 kg) M    LIV  8 SAB 01/19/99          7 Preterm 2000        FD  6 SAB 01/18/97          5 SAB 01/19/96          4 Preterm 07/02/94   4 lb 10 oz (2.098 kg) M    LIV  3 SAB 01/18/93          2 SAB 01/19/92          1 SAB 01/19/91            Past Medical History:  Diagnosis Date  . Heart attack ([place]) 01/2014  . Kidney stones   . Kidney stones     Past Surgical History:  Procedure Laterality Date  . CARDIAC CATHETERIZATION    . CHOLECYSTECTOMY    . ENDOMETRIAL ABLATION    . LAPAROSCOPIC OVARIAN CYSTECTOMY    . TUBAL LIGATION      Current Outpatient Medications on File Prior to Visit  Medication Sig Dispense Refill  . cetirizine (ZYRTEC) 10 MG tablet Take 10 mg by mouth daily.     No current facility-administered medications on file prior to visit.     Allergies  Allergen Reactions  . Penicillins Other (See Comments)    Has patient had a PCN reaction causing immediate rash, facial/tongue/throat swelling, SOB or lightheadedness with  hypotension: Yes Has patient had a PCN reaction causing severe rash involving mucus membranes or skin necrosis: No Has patient had a PCN reaction that required hospitalization: Urgent care visit Has patient had a PCN reaction occurring within the last 10 years: No If all of the above answers are "NO", then may proceed with Cephalosporin use.   . Codeine Itching    (Also) makes her skin feel like "it's crawling"  . Other Other (See Comments)    Narcotic pain meds: Would rather not take, as they cause constipation    Social History   Socioeconomic History  . Marital status: Single    Spouse name: Not on file  . Number of children: Not on file  . Years of education: Not on file  . Highest education level: Not on file  Occupational History  . Not on file  Social Needs  . Financial resource strain: Not on file  . Food insecurity:    Worry: Not  on file    Inability: Not on file  . Transportation needs:    Medical: Not on file    Non-medical: Not on file  Tobacco Use  . Smoking status: Current Some Day Smoker    Packs/day: 0.00    Types: Cigarettes  . Smokeless tobacco: Never Used  Substance and Sexual Activity  . Alcohol use: Yes    Alcohol/week: 1.0 standard drinks    Types: 1 Cans of beer per week  . Drug use: No  . Sexual activity: Yes    Birth control/protection: Surgical  Lifestyle  . Physical activity:    Days per week: Not on file    Minutes per session: Not on file  . Stress: Not on file  Relationships  . Social connections:    Talks on phone: Not on file    Gets together: Not on file    Attends religious service: Not on file    Active member of club or organization: Not on file    Attends meetings of clubs or organizations: Not on file    Relationship status: Not on file  . Intimate partner violence:    Fear of current or ex partner: Not on file    Emotionally abused: Not on file    Physically abused: Not on file    Forced sexual activity: Not on file   Other Topics Concern  . Not on file  Social History Narrative  . Not on file    Family History  Problem Relation Age of Onset  . Asthma Mother   . Hypertension Mother   . Heart failure Maternal Grandfather   . Heart attack Maternal Grandfather   . Prostate cancer Maternal Uncle   . Breast cancer Neg Hx   . Ovarian cancer Neg Hx   . Colon cancer Neg Hx     The following portions of the patient's history were reviewed and updated as appropriate: allergies, current medications, past family history, past medical history, past social history, past surgical history and problem list.  Review of Systems  ROS negative except as noted above. Information obtained from patient.   Objective:   BP 110/70   Pulse (!) 55   Ht 5\' 6"  (1.676 m)   Wt 123 lb 1.6 oz (55.8 kg)   BMI 19.87 kg/m    CONSTITUTIONAL: Well-developed, well-nourished female in no acute distress.   ABDOMEN: Soft, non distended; right sided tenderness with palpation.  No Organomegaly.  Assessment:   1. Pelvic pain  - Ambulatory referral to Physical Therapy   Plan:   Discussed treatment options including repeat ultrasound and follow up with MD and/or pelvic floor physical therapy.   Referral to physical therapy, see orders.   Encouraged to activate MyChart.   Reviewed red flag symptoms and when to call.   RTC as needed.    Diona Fanti, CNM [facility], [facility] 02/09/18 1:34 PM

## 2018-02-09 NOTE — Patient Instructions (Addendum)
WE WOULD LOVE TO HEAR FROM YOU!!!!   Thank you Dorian Pod for visiting [facility].  Providing our patients with the best experience possible is really important to [place], and we hope that you felt that on your recent visit. The most valuable feedback we get comes from [place]!!    If you receive a survey please take a couple of minutes to let us know how we did.Thank you for continuing to trust [place] with your care.   [facility]   Pelvic Pain, Female Pelvic pain is pain in your lower belly (abdomen), below your belly button and between your hips. The pain may start suddenly (be acute), keep coming back (be recurring), or last a long time (become chronic). Pelvic pain that lasts longer than 6 months is called chronic pelvic pain. There are many causes of pelvic pain. Sometimes the cause of pelvic pain is not known. Follow these instructions at home:   Take over-the-counter and prescription medicines only as told by your doctor.  Rest as told by your doctor.  Do not have sex if it hurts.  Keep a journal of your pelvic pain. Write down: ? When the pain started. ? Where the pain is located. ? What seems to make the pain better or worse, such as food or your period (menstrual cycle). ? Any symptoms you have along with the pain.  Keep all follow-up visits as told by your doctor. This is important. Contact a doctor if:  Medicine does not help your pain.  Your pain comes back.  You have new symptoms.  You have unusual discharge or bleeding from your vagina.  You have a fever or chills.  You are having trouble pooping (constipation).  You have blood in your pee (urine) or poop (stool).  Your pee smells bad.  You feel weak or light-headed. Get help right away if:  You have sudden pain that is very bad.  Your pain keeps getting worse.  You have very bad pain and also have any of these symptoms: ? A fever. ? Feeling sick to your stomach  (nausea). ? Throwing up (vomiting). ? Being very sweaty.  You pass out (lose consciousness). Summary  Pelvic pain is pain in your lower belly (abdomen), below your belly button and between your hips.  There are many possible causes of pelvic pain.  Keep a journal of your pelvic pain. This information is not intended to replace advice given to you by your health care provider. Make sure you discuss any questions you have with your health care provider. Document Released: 06/23/2007 Document Revised: 06/22/2017 Document Reviewed: 06/22/2017 Elsevier Interactive Patient Education  2019 [employer].

## 2019-12-05 ENCOUNTER — Ambulatory Visit (INDEPENDENT_AMBULATORY_CARE_PROVIDER_SITE_OTHER): Payer: No Typology Code available for payment source | Admitting: Internal Medicine

## 2019-12-05 ENCOUNTER — Other Ambulatory Visit: Payer: Self-pay

## 2019-12-05 ENCOUNTER — Encounter: Payer: Self-pay | Admitting: Internal Medicine

## 2019-12-05 VITALS — BP 116/66 | HR 61 | Temp 98.0°F | Wt 108.0 lb

## 2019-12-05 DIAGNOSIS — R899 Unspecified abnormal finding in specimens from other organs, systems and tissues: Secondary | ICD-10-CM | POA: Diagnosis not present

## 2019-12-05 NOTE — Patient Instructions (Signed)
You don't have lyme disease   For your fibromyalgia, consider discussing with a rheumatologist regarding sx management   F/u as needed with [place] if recurrent fever, joint swelling, rash

## 2019-12-05 NOTE — Progress Notes (Signed)
[facility] for Infectious Disease      Patient Active Problem List   Diagnosis Date Noted  . 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 Family history of Graves' disease 11/04/2017  . History of removal of ovarian cyst 11/04/2017  . Chest pain of uncertain etiology 13/24/4010  . RBBB 12/10/2013  . Hypercholesterolemia 12/10/2013  . Migraine 12/10/2013  . Tobacco abuse 12/10/2013    Patient's Medications  New Prescriptions   No medications on file  Previous Medications   CETIRIZINE (ZYRTEC) 10 MG TABLET    Take 10 mg by mouth daily.   Modified Medications   No medications on file  Discontinued Medications   No medications on file    HPI: Molly Ellis is a 49 y.o. female fibromyalgia, hx of lyme during her 24s, kidney stone, allergic rhinitis, migraine   Hx of lyme: [place]. Was on 4 weeks doxycycline for bodyache/malaise that occurs after tick exposure for Lyme via general practitioner. Only sx at 10 months after tick (extremities pain, fatigue).    Early October this year developed neck lymph node swelling. Given some antibiotics (zpack) and on f/u resolved. Mono/strep screen negative. Finished zpack but still have pain rue so lyme tested and referred here for positive western blot of lyme igg  Have some cats and dogs at home. Indoor animals  Today patient is feeling mainly tired and a "good amount of pain." worse place rue. She does have baseline pain in several places of fibromyalgia distribution  First week of June has tick on left foot.  Lives in [place], [place].  Takes dogs out several times a day  No fever chill. Michela Pitcher she has temperature 99 at home  She is vaccinated for covid by 04/2019  Meds: Zyrtec Delta ache gummy for pain (pot)   Born Prior travel ([place], [place], [place]); 3 years [place] --> [place] --> [place] (drove through [place] area).  No prior  consumption unpasteruized dairy products  Lost 20 pounds in the past year. Think there was so much going on taking care of her mother that she hasn't been able to keep up intake. Usually keeps in around 115 pounds. Right before her mother passed away she was 102 pounds.   No easy bleeding/bruising  No heat/cold intolerance. But cold does make her pain worse. She likes to have blanket/jacket and keep temperature at 72 inside the house  No diarrhea/constipation/frequent stool  No recurrent oral/mucosal ulcer  No recurrent chest/abd pain  No rash  Review of Systems: ROS Negative 11 point ros unless mentioned above.   Past Medical History:  Diagnosis Date  . Heart attack ([place]) 01/2014  . Kidney stones   . Kidney stones     Social History   Tobacco Use  . Smoking status: Current Some Day Smoker    Packs/day: 0.00    Types: Cigarettes  . Smokeless tobacco: Never Used  Vaping Use  . Vaping Use: Never used  Substance Use Topics  . Alcohol use: Yes    Alcohol/week: 1.0 standard drink    Types: 1 Cans of beer per week    Comment: occ  . Drug use: Yes    Types: Marijuana    Family History  Problem Relation Age of Onset  . Asthma Mother   . Hypertension Mother   . Heart failure Maternal Grandfather   . Heart attack Maternal Grandfather   .  Prostate cancer Maternal Uncle   . Breast cancer Neg Hx   . Ovarian cancer Neg Hx   . Colon cancer Neg Hx    Allergies  Allergen Reactions  . Penicillins Other (See Comments)    Has patient had a PCN reaction causing immediate rash, facial/tongue/throat swelling, SOB or lightheadedness with hypotension: Yes Has patient had a PCN reaction causing severe rash involving mucus membranes or skin necrosis: No Has patient had a PCN reaction that required hospitalization: Urgent care visit Has patient had a PCN reaction occurring within the last 10 years: No If all of the above answers are "NO", then may proceed with Cephalosporin use.    . Codeine Itching    (Also) makes her skin feel like "it's crawling"  . Other Other (See Comments)    Narcotic pain meds: Would rather not take, as they cause constipation    OBJECTIVE: Vitals:   12/05/19 1512  BP: 116/66  Pulse: 61  Temp: 98 F (36.7 C)  TempSrc: Oral  Weight: 108 lb (49 kg)   Body mass index is 17.43 kg/m.   Physical Exam Thin female, no distress, cooperative, pleasant Normocephalic; per; conj clear; eomi Neck supple cv rrr no mrg Lungs clear abd s/nt Ext no edema Skin no rash Neuro cn2-12 intact; strength/gait normal Psych alert/oriented msk no synovitis  Lab: osh labs 11/4 lyme igm blot negative; igg blot positive 3 bands ( p45, 41, 39) Ana negative, rf normal Esr normal Ck 77 Cbc 8/13/299 Cr 0.8; lft wnl; alb 4.1 UA normal  Microbiology: No results found for this or any previous visit (from the past 240 hour(s)).  Serology:  Imaging:   Assessment/plan: Patient is referred for evaluation of lyme, but going through history and looking at labs, she doesn't have clinical syndrome of lyme by lab/hx  I relate to her lymphadenititis could be cat scratch but that would usually resolve on its own. azithrmoycin might make it go away more quickly. But likley could be just a viral syndrome  Her labs look fine otherwise  I discussed with her she might benefit from a rheumatologic evaluation for ssri or the like to treat her fibromyalgia and that might give her some more energy as welll   -consider rheum evaluation -no evidence of lyme disease -keep fever journal and rtc as needed     Patient Active Problem List   Diagnosis Date Noted  . 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 Family history of Graves' disease 11/04/2017  . History of removal of ovarian cyst 11/04/2017  . Chest pain of uncertain etiology 74/82/7078  . RBBB 12/10/2013  . Hypercholesterolemia 12/10/2013  . Migraine 12/10/2013  .  Tobacco abuse 12/10/2013     Problem List Items Addressed This Visit    None       I am having Kenzie C. Biancardi maintain her cetirizine.   No orders of the defined types were placed in this encounter.    Follow-up: No follow-ups on file.  Jabier Mutton, [place] [facility] [place] -- -- pager   [phone] cell 12/05/2019, 3:39 PM

## 2020-02-11 DIAGNOSIS — N200 Calculus of kidney: Secondary | ICD-10-CM | POA: Insufficient documentation

## 2020-02-12 ENCOUNTER — Encounter: Payer: Self-pay | Admitting: *Deleted

## 2020-02-12 ENCOUNTER — Encounter: Payer: Self-pay | Admitting: Cardiology

## 2020-02-12 ENCOUNTER — Other Ambulatory Visit: Payer: Self-pay

## 2020-02-12 ENCOUNTER — Ambulatory Visit: Payer: 59 | Admitting: Cardiology

## 2020-02-12 VITALS — BP 100/70 | HR 74 | Ht 66.0 in | Wt 106.0 lb

## 2020-02-12 DIAGNOSIS — I219 Acute myocardial infarction, unspecified: Secondary | ICD-10-CM | POA: Diagnosis not present

## 2020-02-12 DIAGNOSIS — Z720 Tobacco use: Secondary | ICD-10-CM | POA: Diagnosis not present

## 2020-02-12 DIAGNOSIS — E7800 Pure hypercholesterolemia, unspecified: Secondary | ICD-10-CM

## 2020-02-12 DIAGNOSIS — I4510 Unspecified right bundle-branch block: Secondary | ICD-10-CM

## 2020-02-12 NOTE — Progress Notes (Signed)
Cardiology Consultation:    Date:  02/12/2020   ID:  Molly Ellis, DOB [birth year], MRN [id number]  PCP:  Noni Saupe, MD  Cardiologist:  Gypsy Balsam, MD   Referring MD: Noni Saupe, MD   Chief Complaint  Patient presents with  . Coronary Artery Disease    History of Present Illness:    Molly Ellis is a 50 y.o. female who is being seen today for the evaluation of coronary disease at the request of [place], John F. II, MD.  Apparently in 2015 she ended going to the hospital because of atypical chest pain the left side after that visit she was told to have myocardial infarction she is not sure exactly how big it was where was however she was told that she does have evidence of myocardial infarction.  I do have some EKGs from that time and EKG already show right bundle branch block.  Overall she is doing well she denies have any chest pain tightness squeezing pressure burning chest.  The biggest complaint she has is when she gets up very quickly try to walk she will get dizzy initially but then she can continue she never injured herself falling down with this kind of events but sometimes she had to slow down and sometimes even sit on the floor.  Denies have any palpitations, there is no swelling of lower extremities there is no chest pain tightness squeezing pressure burning chest.  She try to exercise on the regular basis she does yoga.  Does not do any endurance sports.  Sadly she still continues to smoke between 3 and 5 cigarettes a day.  She drinks some beer as well as coffee.  Past Medical History:  Diagnosis Date  . 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 Chest pain of uncertain etiology 12/10/2013  . Family history of Graves' disease 11/04/2017  . Heart attack (HCC) 01/2014  . History of removal of ovarian cyst 11/04/2017  . Hypercholesterolemia 12/10/2013  . Kidney stones   . Kidney stones   . Migraine  12/10/2013  . RBBB 12/10/2013  . Tobacco abuse 12/10/2013    Past Surgical History:  Procedure Laterality Date  . CARDIAC CATHETERIZATION    . CHOLECYSTECTOMY    . ENDOMETRIAL ABLATION    . LAPAROSCOPIC OVARIAN CYSTECTOMY    . TUBAL LIGATION      Current Medications: Current Meds  Medication Sig  . aspirin EC 81 MG tablet Take 81 mg by mouth daily. Swallow whole.  . cetirizine (ZYRTEC) 10 MG tablet Take 10 mg by mouth daily.   . fluticasone (FLONASE) 50 MCG/ACT nasal spray Place 1 spray into both nostrils daily.  . rosuvastatin (CRESTOR) 5 MG tablet Take 5 mg by mouth daily.     Allergies:   Penicillins, Codeine, Cymbalta [duloxetine hcl], and Other   Social History   Socioeconomic History  . Marital status: Single    Spouse name: Not on file  . Number of children: Not on file  . Years of education: Not on file  . Highest education level: Not on file  Occupational History  . Not on file  Tobacco Use  . Smoking status: Current Every Day Smoker    Packs/day: 0.00    Types: Cigarettes  . Smokeless tobacco: Never Used  Vaping Use  . Vaping Use: Never used  Substance and Sexual Activity  . Alcohol use: Yes    Alcohol/week: 1.0 standard drink  Types: 1 Cans of beer per week    Comment: occ  . Drug use: Yes    Types: Marijuana  . Sexual activity: Yes    Birth control/protection: Surgical  Other Topics Concern  . Not on file  Social History Narrative  . Not on file   Social Determinants of Health   Financial Resource Strain: Not on file  Food Insecurity: Not on file  Transportation Needs: Not on file  Physical Activity: Not on file  Stress: Not on file  Social Connections: Not on file     Family History: The patient's family history includes Asthma in her mother; Heart attack in her maternal grandfather; Heart failure in her maternal grandfather; Hypertension in her father and mother; Prostate cancer in her maternal uncle; Stroke in her mother. There is no  history of Breast cancer, Ovarian cancer, or Colon cancer. ROS:   Please see the history of present illness.    All 14 point review of systems negative except as described per history of present illness.  EKGs/Labs/Other Studies Reviewed:    The following studies were reviewed today:   EKG:  EKG is  ordered today.  The ekg ordered today demonstrates normal sinus rhythm normal P interval incomplete right bundle branch block, possibly right ventricle hypertrophy.  Recent Labs: No results found for requested labs within last 8760 hours.  Recent Lipid Panel No results found for: CHOL, TRIG, HDL, CHOLHDL, VLDL, LDLCALC, LDLDIRECT  Physical Exam:    VS:  BP 100/70 (BP Location: Left Arm, Patient Position: Sitting, Cuff Size: Normal)   Pulse 74   Ht 5\' 6"  (1.676 m)   Wt 106 lb (48.1 kg)   SpO2 97%   BMI 17.11 kg/m     Wt Readings from Last 3 Encounters:  02/12/20 106 lb (48.1 kg)  02/06/20 108 lb (49 kg)  12/05/19 108 lb (49 kg)     GEN:  Well nourished, well developed in no acute distress HEENT: Normal NECK: No JVD; No carotid bruits LYMPHATICS: No lymphadenopathy CARDIAC: RRR, no murmurs, no rubs, no gallops RESPIRATORY:  Clear to auscultation without rales, wheezing or rhonchi  ABDOMEN: Soft, non-tender, non-distended MUSCULOSKELETAL:  No edema; No deformity  SKIN: Warm and dry NEUROLOGIC:  Alert and oriented x 3 PSYCHIATRIC:  Normal affect   ASSESSMENT:    1. Myocardial infarction, unspecified MI type, unspecified artery ([place])   2. RBBB   3. Tobacco abuse   4. Hypercholesterolemia    PLAN:    In order of problems listed above:  1. History of myocardial infarction I do not see evidence of this in the EKG possibility of Q waves inferiorly but not significant enough to make a diagnosis.  I will schedule her to have echocardiogram to assess left ventricle ejection fraction.  Will not alter any of her medication right now. 2. Right bundle branch block which is  incomplete with possibility of right ventricle hypertrophy obviously concerning.  She is a smoker she does have probably COPD which could cause the problem however I will make sure were not dealing with some additional pathology.  Echocardiogram will allow [place] to look at the right ventricle size and function, also will be looking for possibility of shunt across a at bedtime. 3. Tobacco abuse "big problem we spent real-time talk about this I strongly recommend to quit she understands she will try to do that.  However she told me this is the only thing she lacks in her bad habits and she  is prefer not to stop it for now. 4. Dyslipidemia: I will not alter any of her medication right now her HDL is quite high a LDL is slightly elevated if she will have segmental motion abnormality on the echocardiogram then will be much more aggressive in management of this problem.  In the future we also may consider doing coronary CT angio to rule out significant coronary disease.   Medication Adjustments/Labs and Tests Ordered: Current medicines are reviewed at length with the patient today.  Concerns regarding medicines are outlined above.  Orders Placed This Encounter  Procedures  . EKG 12-Lead  . ECHOCARDIOGRAM COMPLETE   No orders of the defined types were placed in this encounter.   Signed, Park Liter, MD, [facility]. 02/12/2020 4:43 PM    [place]

## 2020-02-12 NOTE — Patient Instructions (Signed)
Medication Instructions:  Your physician recommends that you continue on your current medications as directed. Please refer to the Current Medication list given to you today.  *If you need a refill on your cardiac medications before your next appointment, please call your pharmacy*   Lab Work: None If you have labs (blood work) drawn today and your tests are completely normal, you will receive your results only by: Marland Kitchen MyChart Message (if you have MyChart) OR . A paper copy in the mail If you have any lab test that is abnormal or we need to change your treatment, we will call you to review the results.   Testing/Procedures: Your physician has requested that you have an echocardiogram. Echocardiography is a painless test that uses sound waves to create images of your heart. It provides your doctor with information about the size and shape of your heart and how well your heart's chambers and valves are working. This procedure takes approximately one hour. There are no restrictions for this procedure.     Follow-Up: At [facility], you and your health needs are our priority.  As part of our continuing mission to provide you with exceptional heart care, we have created designated Provider Care Teams.  These Care Teams include your primary Cardiologist (physician) and Advanced Practice Providers (APPs -  Physician Assistants and Nurse Practitioners) who all work together to provide you with the care you need, when you need it.  We recommend signing up for the patient portal called "MyChart".  Sign up information is provided on this After Visit Summary.  MyChart is used to connect with patients for Virtual Visits (Telemedicine).  Patients are able to view lab/test results, encounter notes, upcoming appointments, etc.  Non-urgent messages can be sent to your provider as well.   To learn more about what you can do with MyChart, go to [email or web address].    Your next appointment:   4  week(s)  The format for your next appointment:   In Person  Provider:   Jenne Campus, MD   Other Instructions   Echocardiogram An echocardiogram is a test that uses sound waves (ultrasound) to produce images of the heart. Images from an echocardiogram can provide important information about:  Heart size and shape.  The size and thickness and movement of your heart's walls.  Heart muscle function and strength.  Heart valve function or if you have stenosis. Stenosis is when the heart valves are too narrow.  If blood is flowing backward through the heart valves (regurgitation).  A tumor or infectious growth around the heart valves.  Areas of heart muscle that are not working well because of poor blood flow or injury from a heart attack.  Aneurysm detection. An aneurysm is a weak or damaged part of an artery wall. The wall bulges out from the normal force of blood pumping through the body. Tell a health care provider about:  Any allergies you have.  All medicines you are taking, including vitamins, herbs, eye drops, creams, and over-the-counter medicines.  Any blood disorders you have.  Any surgeries you have had.  Any medical conditions you have.  Whether you are pregnant or may be pregnant. What are the risks? Generally, this is a safe test. However, problems may occur, including an allergic reaction to dye (contrast) that may be used during the test. What happens before the test? No specific preparation is needed. You may eat and drink normally. What happens during the test?  You will take off your  clothes from the waist up and put on a hospital gown.  Electrodes or electrocardiogram (ECG)patches may be placed on your chest. The electrodes or patches are then connected to a device that monitors your heart rate and rhythm.  You will lie down on a table for an ultrasound exam. A gel will be applied to your chest to help sound waves pass through your skin.  A  handheld device, called a transducer, will be pressed against your chest and moved over your heart. The transducer produces sound waves that travel to your heart and bounce back (or "echo" back) to the transducer. These sound waves will be captured in real-time and changed into images of your heart that can be viewed on a video monitor. The images will be recorded on a computer and reviewed by your health care provider.  You may be asked to change positions or hold your breath for a short time. This makes it easier to get different views or better views of your heart.  In some cases, you may receive contrast through an IV in one of your veins. This can improve the quality of the pictures from your heart. The procedure may vary among health care providers and hospitals.   What can I expect after the test? You may return to your normal, everyday life, including diet, activities, and medicines, unless your health care provider tells you not to do that. Follow these instructions at home:  It is up to you to get the results of your test. Ask your health care provider, or the department that is doing the test, when your results will be ready.  Keep all follow-up visits. This is important. Summary  An echocardiogram is a test that uses sound waves (ultrasound) to produce images of the heart.  Images from an echocardiogram can provide important information about the size and shape of your heart, heart muscle function, heart valve function, and other possible heart problems.  You do not need to do anything to prepare before this test. You may eat and drink normally.  After the echocardiogram is completed, you may return to your normal, everyday life, unless your health care provider tells you not to do that. This information is not intended to replace advice given to you by your health care provider. Make sure you discuss any questions you have with your health care provider. Document Revised:  08/28/2019 Document Reviewed: 08/28/2019 Elsevier Patient Education  2021 Elsevier Inc.   

## 2020-02-13 LAB — HM PAP SMEAR: Chlamydia, Swab/Urine, PCR: NEGATIVE

## 2020-02-25 ENCOUNTER — Ambulatory Visit (INDEPENDENT_AMBULATORY_CARE_PROVIDER_SITE_OTHER): Payer: 59 | Admitting: Cardiology

## 2020-02-25 ENCOUNTER — Other Ambulatory Visit: Payer: Self-pay

## 2020-02-25 ENCOUNTER — Encounter: Payer: Self-pay | Admitting: Cardiology

## 2020-02-25 VITALS — BP 122/76 | HR 69 | Ht 66.0 in | Wt 108.0 lb

## 2020-02-25 DIAGNOSIS — Z8249 Family history of ischemic heart disease and other diseases of the circulatory system: Secondary | ICD-10-CM

## 2020-02-25 DIAGNOSIS — E782 Mixed hyperlipidemia: Secondary | ICD-10-CM

## 2020-02-25 DIAGNOSIS — I208 Other forms of angina pectoris: Secondary | ICD-10-CM

## 2020-02-25 DIAGNOSIS — I209 Angina pectoris, unspecified: Secondary | ICD-10-CM | POA: Insufficient documentation

## 2020-02-25 DIAGNOSIS — F17200 Nicotine dependence, unspecified, uncomplicated: Secondary | ICD-10-CM

## 2020-02-25 HISTORY — DX: Mixed hyperlipidemia: E78.2

## 2020-02-25 HISTORY — DX: Other forms of angina pectoris: I20.8

## 2020-02-25 MED ORDER — NITROGLYCERIN 0.4 MG SL SUBL: 0.4000 mg | SUBLINGUAL_TABLET | SUBLINGUAL | 3 refills | Status: AC | PRN

## 2020-02-25 NOTE — Patient Instructions (Signed)
Medication Instructions:  Your physician has recommended you make the following change in your medication:  START: Nitroglycerin 0.4 mg take one tablet by mouth every 5 minutes up to three times as needed for chest pain.  *If you need a refill on your cardiac medications before your next appointment, please call your pharmacy*   Lab Work: Your physician recommends that you return for lab work: TODAY BMET, CBC  If you have labs (blood work) drawn today and your tests are completely normal, you will receive your results only by: Marland Kitchen MyChart Message (if you have MyChart) OR . A paper copy in the mail If you have any lab test that is abnormal or we need to change your treatment, we will call you to review the results.   Testing/Procedures:    [place] [address] Dept: [phone] Loc: [phone]  Molly Ellis  02/25/2020  You are scheduled for a Cardiac Catheterization on Wednesday, February 16 with Dr. Glenetta Hew.  1. Please arrive at the [facility] (Main Entrance A) at [facility]: [address] [place], [place] [address] at 6:30 AM (This time is two hours before your procedure to ensure your preparation). Free valet parking service is available.   Special note: Every effort is made to have your procedure done on time. Please understand that emergencies sometimes delay scheduled procedures.  2. Diet: Do not eat solid foods after midnight.  The patient may have clear liquids until 5am upon the day of the procedure.  3. Labs: You will need to have blood drawn on TODAY.  4. Medication instructions in preparation for your procedure:   On the morning of your procedure, take your Aspirin and any morning medicines NOT listed above.  You may use sips of water.  5. Plan for one night stay--bring personal belongings. 6. Bring a current list of your medications  and current insurance cards. 7. You MUST have a responsible person to drive you home. 8. Someone MUST be with you the first 24 hours after you arrive home or your discharge will be delayed. 9. Please wear clothes that are easy to get on and off and wear slip-on shoes.  Thank you for allowing [place] to care for you!   -- [facility]    Follow-Up: At [facility], you and your health needs are our priority.  As part of our continuing mission to provide you with exceptional heart care, we have created designated Provider Care Teams.  These Care Teams include your primary Cardiologist (physician) and Advanced Practice Providers (APPs -  Physician Assistants and Nurse Practitioners) who all work together to provide you with the care you need, when you need it.  We recommend signing up for the patient portal called "MyChart".  Sign up information is provided on this After Visit Summary.  MyChart is used to connect with patients for Virtual Visits (Telemedicine).  Patients are able to view lab/test results, encounter notes, upcoming appointments, etc.  Non-urgent messages can be sent to your provider as well.   To learn more about what you can do with MyChart, go to [email or web address].    Your next appointment:   2 week(s) after your Cath  The format for your next appointment:   In Person  Provider:   Berniece Salines, DO   Other Instructions

## 2020-02-25 NOTE — H&P (View-Only) (Signed)
Cardiology Office Note:    Date:  02/25/2020   ID:  Molly Ellis, DOB [birth year], MRN [id number]  PCP:  Angelina Sheriff, MD  Cardiologist:  No primary care provider on file.  Electrophysiologist:  None   Referring MD: Angelina Sheriff, MD   I have been having chest pain off and on all weekend and even this morning.  History of Present Illness:    Molly Ellis is a 50 y.o. female with a hx of hypercholesterolemia, current smoker, family history of premature coronary artery disease with her mother first MI in her 31s, presents today to be evaluated for chest pain.  The patient follows with Dr. Agustin Cree and was last seen by him on February 12, 2020 at that time she appeared to expressing what was described as atypical chest pain.  During our visit she was order echocardiogram and asked for follow-up.  Today the patient tells me over the weekend she has had tremendous worsening of her left-sided pain.  She described this as a left-sided tightness sensations that sometimes radiates up her left shoulder but then mostly to her right arm and down that arm..  She states she did end up at urgent care this weekend and was sent to the emergency department at [facility].  During her visit she had troponin x2 which was within normal.  The patient says she was sent home to follow-up with her cardiologist. She said all throughout the weekend she has had intermittent left-sided pain she quantifies it at times had a 9 out of 10 but mostly is been staying around a 3 out of 10.  She is concerned about this giving her mother history with MI in age 42 and also getting stent at that time.     Past Medical History:  Diagnosis Date  . 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 Chest pain of uncertain etiology [id number]  . Family history of Graves' disease 11/04/2017  . Heart attack ([place]) 01/2014  . History of removal of ovarian cyst 11/04/2017   . Hypercholesterolemia 12/10/2013  . Kidney stones   . Kidney stones   . Migraine 12/10/2013  . RBBB 12/10/2013  . Tobacco abuse 12/10/2013    Past Surgical History:  Procedure Laterality Date  . CARDIAC CATHETERIZATION    . CHOLECYSTECTOMY    . ENDOMETRIAL ABLATION    . LAPAROSCOPIC OVARIAN CYSTECTOMY    . TUBAL LIGATION      Current Medications: Current Meds  Medication Sig  . aspirin EC 81 MG tablet Take 81 mg by mouth daily. Swallow whole.  . cetirizine (ZYRTEC) 10 MG tablet Take 10 mg by mouth daily.   . fluticasone (FLONASE) 50 MCG/ACT nasal spray Place 1 spray into both nostrils daily.  . rosuvastatin (CRESTOR) 5 MG tablet Take 5 mg by mouth daily.     Allergies:   Penicillins, Codeine, Cymbalta [duloxetine hcl], and Other   Social History   Socioeconomic History  . Marital status: Single    Spouse name: Not on file  . Number of children: Not on file  . Years of education: Not on file  . Highest education level: Not on file  Occupational History  . Not on file  Tobacco Use  . Smoking status: Current Every Day Smoker    Packs/day: 0.00    Types: Cigarettes  . Smokeless tobacco: Never Used  Vaping Use  . Vaping Use: Never used  Substance and  Sexual Activity  . Alcohol use: Yes    Alcohol/week: 1.0 standard drink    Types: 1 Cans of beer per week    Comment: occ  . Drug use: Yes    Types: Marijuana  . Sexual activity: Yes    Birth control/protection: Surgical  Other Topics Concern  . Not on file  Social History Narrative  . Not on file   Social Determinants of Health   Financial Resource Strain: Not on file  Food Insecurity: Not on file  Transportation Needs: Not on file  Physical Activity: Not on file  Stress: Not on file  Social Connections: Not on file     Family History: The patient's family history includes Asthma in her mother; Heart attack in her maternal grandfather; Heart failure in her maternal grandfather; Hypertension in her  father and mother; Prostate cancer in her maternal uncle; Stroke in her mother. There is no history of Breast cancer, Ovarian cancer, or Colon cancer.  ROS:   Review of Systems  Constitution: Negative for decreased appetite, fever and weight gain.  HENT: Negative for congestion, ear discharge, hoarse voice and sore throat.   Eyes: Negative for discharge, redness, vision loss in right eye and visual halos.  Cardiovascular: Negative for chest pain, dyspnea on exertion, leg swelling, orthopnea and palpitations.  Respiratory: Negative for cough, hemoptysis, shortness of breath and snoring.   Endocrine: Negative for heat intolerance and polyphagia.  Hematologic/Lymphatic: Negative for bleeding problem. Does not bruise/bleed easily.  Skin: Negative for flushing, nail changes, rash and suspicious lesions.  Musculoskeletal: Negative for arthritis, joint pain, muscle cramps, myalgias, neck pain and stiffness.  Gastrointestinal: Negative for abdominal pain, bowel incontinence, diarrhea and excessive appetite.  Genitourinary: Negative for decreased libido, genital sores and incomplete emptying.  Neurological: Negative for brief paralysis, focal weakness, headaches and loss of balance.  Psychiatric/Behavioral: Negative for altered mental status, depression and suicidal ideas.  Allergic/Immunologic: Negative for HIV exposure and persistent infections.    EKGs/Labs/Other Studies Reviewed:    The following studies were reviewed today:   EKG:  The ekg ordered today demonstrates sinus rhythm, heart rate 69 bpm with arrhythmia.  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  Compared to prior EKG done on 02/12/2020 no significant change.  Recent Labs: No results found for requested labs within last 8760 hours.  Recent Lipid Panel No results found  for: CHOL, TRIG, HDL, CHOLHDL, VLDL, LDLCALC, LDLDIRECT  Physical Exam:    VS:  BP 122/76   Pulse 69   Ht 5\' 6"  (1.676 m)   Wt 108 lb (49 kg)   SpO2 99%   BMI 17.43 kg/m     Wt Readings from Last 3 Encounters:  02/25/20 108 lb (49 kg)  02/12/20 106 lb (48.1 kg)  02/06/20 108 lb (49 kg)     GEN: Well nourished, well developed in no acute distress HEENT: Normal NECK: No JVD; No carotid bruits LYMPHATICS: No lymphadenopathy CARDIAC: S1S2 noted,RRR, no murmurs, rubs, gallops RESPIRATORY:  Clear to auscultation without rales, wheezing or rhonchi  ABDOMEN: Soft, non-tender, non-distended, +bowel sounds, no guarding. EXTREMITIES: No edema, No cyanosis, no clubbing MUSCULOSKELETAL:  No deformity  SKIN: Warm and dry NEUROLOGIC:  Alert and oriented x 3, non-focal PSYCHIATRIC:  Normal affect, good insight  ASSESSMENT:    1. Stable angina pectoris ([place])   2.  Mixed hyperlipidemia   3. Smoker    PLAN:     She still experiencing intermittent chest pain which she tells me is worse for a few seconds and resolved.  What I like to do with this patient who has intermediate to high risk factors for coronary artery disease (premature family history of coronary artery disease, current symptoms, smoker, hyperlipidemia (I like to proceed with a left heart catheterization in this patient.  I have educated patient about heart catheterization she is agreeable to proceed.  The patient understands that risks include but are not limited to stroke (1 in 1000), death (1 in 63), kidney failure [usually temporary] (1 in 500), bleeding (1 in 200), allergic reaction [possibly serious] (1 in 200), and agrees to proceed.  Sublingual nitroglycerin prescription was sent, its protocol and 911 protocol explained and the patient vocalized understanding questions were answered to the patient's satisfaction  Her echocardiogram is still pending.  Smoking cessation advised.  The patient is in agreement with the  above plan. The patient left the office in stable condition.  The patient will follow up in 2 weeks post heart catheterization   Medication Adjustments/Labs and Tests Ordered: Current medicines are reviewed at length with the patient today.  Concerns regarding medicines are outlined above.  No orders of the defined types were placed in this encounter.  No orders of the defined types were placed in this encounter.   There are no Patient Instructions on file for this visit.   Adopting a Healthy Lifestyle.  Know what a healthy weight is for you (roughly BMI <25) and aim to maintain this   Aim for 7+ servings of fruits and vegetables daily   65-80+ fluid ounces of water or unsweet tea for healthy kidneys   Limit to max 1 drink of alcohol per day; avoid smoking/tobacco   Limit animal fats in diet for cholesterol and heart health - choose grass fed whenever available   Avoid highly processed foods, and foods high in saturated/trans fats   Aim for low stress - take time to unwind and care for your mental health   Aim for 150 min of moderate intensity exercise weekly for heart health, and weights twice weekly for bone health   Aim for 7-9 hours of sleep daily   When it comes to diets, agreement about the perfect plan isnt easy to find, even among the experts. Experts at the [employer] developed an idea known as the Healthy Eating Plate. Just imagine a plate divided into logical, healthy portions.   The emphasis is on diet quality:   Load up on vegetables and fruits - one-half of your plate: Aim for color and variety, and remember that potatoes dont count.   Go for whole grains - one-quarter of your plate: Whole wheat, barley, wheat berries, quinoa, oats, brown rice, and foods made with them. If you want pasta, go with whole wheat pasta.   Protein power - one-quarter of your plate: Fish, chicken, beans, and nuts are all healthy, versatile protein sources. Limit  red meat.   The diet, however, does go beyond the plate, offering a few other suggestions.   Use healthy plant oils, such as olive, canola, soy, corn, sunflower and peanut. Check the labels, and avoid partially hydrogenated oil, which have unhealthy trans fats.   If youre thirsty, drink water. Coffee and tea are good in moderation, but skip sugary drinks and limit milk and dairy products to one or two daily  servings.   The type of carbohydrate in the diet is more important than the amount. Some sources of carbohydrates, such as vegetables, fruits, whole grains, and beans-are healthier than others.   Finally, stay active  Signed, Berniece Salines, DO  02/25/2020 1:56 PM    [facility]

## 2020-02-25 NOTE — Progress Notes (Signed)
Cardiology Office Note:    Date:  02/25/2020   ID:  Molly Ellis, DOB March 28, 1970, MRN [id number]  PCP:  Angelina Sheriff, MD  Cardiologist:  No primary care provider on file.  Electrophysiologist:  None   Referring MD: Angelina Sheriff, MD   I have been having chest pain off and on all weekend and even this morning.  History of Present Illness:    Molly Ellis is a 50 y.o. female with a hx of hypercholesterolemia, current smoker, family history of premature coronary artery disease with her mother first MI in her 26s, presents today to be evaluated for chest pain.  The patient follows with Dr. Agustin Cree and was last seen by him on February 12, 2020 at that time she appeared to expressing what was described as atypical chest pain.  During our visit she was order echocardiogram and asked for follow-up.  Today the patient tells me over the weekend she has had tremendous worsening of her left-sided pain.  She described this as a left-sided tightness sensations that sometimes radiates up her left shoulder but then mostly to her right arm and down that arm..  She states she did end up at urgent care this weekend and was sent to the emergency department at [facility].  During her visit she had troponin x2 which was within normal.  The patient says she was sent home to follow-up with her cardiologist. She said all throughout the weekend she has had intermittent left-sided pain she quantifies it at times had a 9 out of 10 but mostly is been staying around a 3 out of 10.  She is concerned about this giving her mother history with MI in age 81 and also getting stent at that time.     Past Medical History:  Diagnosis Date  . 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 Chest pain of uncertain etiology [id number]  . Family history of Graves' disease 11/04/2017  . Heart attack ([place]) 01/2014  . History of removal of ovarian cyst 11/04/2017   . Hypercholesterolemia 12/10/2013  . Kidney stones   . Kidney stones   . Migraine 12/10/2013  . RBBB 12/10/2013  . Tobacco abuse 12/10/2013    Past Surgical History:  Procedure Laterality Date  . CARDIAC CATHETERIZATION    . CHOLECYSTECTOMY    . ENDOMETRIAL ABLATION    . LAPAROSCOPIC OVARIAN CYSTECTOMY    . TUBAL LIGATION      Current Medications: Current Meds  Medication Sig  . aspirin EC 81 MG tablet Take 81 mg by mouth daily. Swallow whole.  . cetirizine (ZYRTEC) 10 MG tablet Take 10 mg by mouth daily.   . fluticasone (FLONASE) 50 MCG/ACT nasal spray Place 1 spray into both nostrils daily.  . rosuvastatin (CRESTOR) 5 MG tablet Take 5 mg by mouth daily.     Allergies:   Penicillins, Codeine, Cymbalta [duloxetine hcl], and Other   Social History   Socioeconomic History  . Marital status: Single    Spouse name: Not on file  . Number of children: Not on file  . Years of education: Not on file  . Highest education level: Not on file  Occupational History  . Not on file  Tobacco Use  . Smoking status: Current Every Day Smoker    Packs/day: 0.00    Types: Cigarettes  . Smokeless tobacco: Never Used  Vaping Use  . Vaping Use: Never used  Substance and  Sexual Activity  . Alcohol use: Yes    Alcohol/week: 1.0 standard drink    Types: 1 Cans of beer per week    Comment: occ  . Drug use: Yes    Types: Marijuana  . Sexual activity: Yes    Birth control/protection: Surgical  Other Topics Concern  . Not on file  Social History Narrative  . Not on file   Social Determinants of Health   Financial Resource Strain: Not on file  Food Insecurity: Not on file  Transportation Needs: Not on file  Physical Activity: Not on file  Stress: Not on file  Social Connections: Not on file     Family History: The patient's family history includes Asthma in her mother; Heart attack in her maternal grandfather; Heart failure in her maternal grandfather; Hypertension in her  father and mother; Prostate cancer in her maternal uncle; Stroke in her mother. There is no history of Breast cancer, Ovarian cancer, or Colon cancer.  ROS:   Review of Systems  Constitution: Negative for decreased appetite, fever and weight gain.  HENT: Negative for congestion, ear discharge, hoarse voice and sore throat.   Eyes: Negative for discharge, redness, vision loss in right eye and visual halos.  Cardiovascular: Negative for chest pain, dyspnea on exertion, leg swelling, orthopnea and palpitations.  Respiratory: Negative for cough, hemoptysis, shortness of breath and snoring.   Endocrine: Negative for heat intolerance and polyphagia.  Hematologic/Lymphatic: Negative for bleeding problem. Does not bruise/bleed easily.  Skin: Negative for flushing, nail changes, rash and suspicious lesions.  Musculoskeletal: Negative for arthritis, joint pain, muscle cramps, myalgias, neck pain and stiffness.  Gastrointestinal: Negative for abdominal pain, bowel incontinence, diarrhea and excessive appetite.  Genitourinary: Negative for decreased libido, genital sores and incomplete emptying.  Neurological: Negative for brief paralysis, focal weakness, headaches and loss of balance.  Psychiatric/Behavioral: Negative for altered mental status, depression and suicidal ideas.  Allergic/Immunologic: Negative for HIV exposure and persistent infections.    EKGs/Labs/Other Studies Reviewed:    The following studies were reviewed today:   EKG:  The ekg ordered today demonstrates sinus rhythm, heart rate 69 bpm with arrhythmia.  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  Compared to prior EKG done on 02/12/2020 no significant change.  Recent Labs: No results found for requested labs within last 8760 hours.  Recent Lipid Panel No results found  for: CHOL, TRIG, HDL, CHOLHDL, VLDL, LDLCALC, LDLDIRECT  Physical Exam:    VS:  BP 122/76   Pulse 69   Ht 5\' 6"  (1.676 m)   Wt 108 lb (49 kg)   SpO2 99%   BMI 17.43 kg/m     Wt Readings from Last 3 Encounters:  02/25/20 108 lb (49 kg)  02/12/20 106 lb (48.1 kg)  02/06/20 108 lb (49 kg)     GEN: Well nourished, well developed in no acute distress HEENT: Normal NECK: No JVD; No carotid bruits LYMPHATICS: No lymphadenopathy CARDIAC: S1S2 noted,RRR, no murmurs, rubs, gallops RESPIRATORY:  Clear to auscultation without rales, wheezing or rhonchi  ABDOMEN: Soft, non-tender, non-distended, +bowel sounds, no guarding. EXTREMITIES: No edema, No cyanosis, no clubbing MUSCULOSKELETAL:  No deformity  SKIN: Warm and dry NEUROLOGIC:  Alert and oriented x 3, non-focal PSYCHIATRIC:  Normal affect, good insight  ASSESSMENT:    1. Stable angina pectoris ([place])   2.  Mixed hyperlipidemia   3. Smoker    PLAN:     She still experiencing intermittent chest pain which she tells me is worse for a few seconds and resolved.  What I like to do with this patient who has intermediate to high risk factors for coronary artery disease (premature family history of coronary artery disease, current symptoms, smoker, hyperlipidemia (I like to proceed with a left heart catheterization in this patient.  I have educated patient about heart catheterization she is agreeable to proceed.  The patient understands that risks include but are not limited to stroke (1 in 1000), death (1 in 63), kidney failure [usually temporary] (1 in 500), bleeding (1 in 200), allergic reaction [possibly serious] (1 in 200), and agrees to proceed.  Sublingual nitroglycerin prescription was sent, its protocol and 911 protocol explained and the patient vocalized understanding questions were answered to the patient's satisfaction  Her echocardiogram is still pending.  Smoking cessation advised.  The patient is in agreement with the  above plan. The patient left the office in stable condition.  The patient will follow up in 2 weeks post heart catheterization   Medication Adjustments/Labs and Tests Ordered: Current medicines are reviewed at length with the patient today.  Concerns regarding medicines are outlined above.  No orders of the defined types were placed in this encounter.  No orders of the defined types were placed in this encounter.   There are no Patient Instructions on file for this visit.   Adopting a Healthy Lifestyle.  Know what a healthy weight is for you (roughly BMI <25) and aim to maintain this   Aim for 7+ servings of fruits and vegetables daily   65-80+ fluid ounces of water or unsweet tea for healthy kidneys   Limit to max 1 drink of alcohol per day; avoid smoking/tobacco   Limit animal fats in diet for cholesterol and heart health - choose grass fed whenever available   Avoid highly processed foods, and foods high in saturated/trans fats   Aim for low stress - take time to unwind and care for your mental health   Aim for 150 min of moderate intensity exercise weekly for heart health, and weights twice weekly for bone health   Aim for 7-9 hours of sleep daily   When it comes to diets, agreement about the perfect plan isnt easy to find, even among the experts. Experts at the [employer] developed an idea known as the Healthy Eating Plate. Just imagine a plate divided into logical, healthy portions.   The emphasis is on diet quality:   Load up on vegetables and fruits - one-half of your plate: Aim for color and variety, and remember that potatoes dont count.   Go for whole grains - one-quarter of your plate: Whole wheat, barley, wheat berries, quinoa, oats, brown rice, and foods made with them. If you want pasta, go with whole wheat pasta.   Protein power - one-quarter of your plate: Fish, chicken, beans, and nuts are all healthy, versatile protein sources. Limit  red meat.   The diet, however, does go beyond the plate, offering a few other suggestions.   Use healthy plant oils, such as olive, canola, soy, corn, sunflower and peanut. Check the labels, and avoid partially hydrogenated oil, which have unhealthy trans fats.   If youre thirsty, drink water. Coffee and tea are good in moderation, but skip sugary drinks and limit milk and dairy products to one or two daily  servings.   The type of carbohydrate in the diet is more important than the amount. Some sources of carbohydrates, such as vegetables, fruits, whole grains, and beans-are healthier than others.   Finally, stay active  Signed, Berniece Salines, DO  02/25/2020 1:56 PM    [facility]

## 2020-02-26 LAB — CBC WITH DIFFERENTIAL/PLATELET
Basophils Absolute: 0.1 10*3/uL (ref 0.0–0.2)
Basos: 1 %
EOS (ABSOLUTE): 0.1 10*3/uL (ref 0.0–0.4)
Eos: 1 %
Hematocrit: 39.9 % (ref 34.0–46.6)
Hemoglobin: 13.9 g/dL (ref 11.1–15.9)
Immature Grans (Abs): 0 10*3/uL (ref 0.0–0.1)
Immature Granulocytes: 0 %
Lymphocytes Absolute: 1.8 10*3/uL (ref 0.7–3.1)
Lymphs: 18 %
MCH: 32.7 pg (ref 26.6–33.0)
MCHC: 34.8 g/dL (ref 31.5–35.7)
MCV: 94 fL (ref 79–97)
Monocytes Absolute: 0.7 10*3/uL (ref 0.1–0.9)
Monocytes: 7 %
Neutrophils Absolute: 7.3 10*3/uL — ABNORMAL HIGH (ref 1.4–7.0)
Neutrophils: 73 %
Platelets: 274 10*3/uL (ref 150–450)
RBC: 4.25 x10E6/uL (ref 3.77–5.28)
RDW: 12.3 % (ref 11.7–15.4)
WBC: 10 10*3/uL (ref 3.4–10.8)

## 2020-02-26 LAB — BASIC METABOLIC PANEL
BUN/Creatinine Ratio: 15 (ref 9–23)
BUN: 12 mg/dL (ref 6–24)
CO2: 21 mmol/L (ref 20–29)
Calcium: 9.9 mg/dL (ref 8.7–10.2)
Chloride: 103 mmol/L (ref 96–106)
Creatinine, Ser: 0.79 mg/dL (ref 0.57–1.00)
GFR calc Af Amer: 102 mL/min/{1.73_m2} (ref >59)
GFR calc non Af Amer: 89 mL/min/{1.73_m2} (ref >59)
Glucose: 93 mg/dL (ref 65–99)
Potassium: 4.2 mmol/L (ref 3.5–5.2)
Sodium: 142 mmol/L (ref 134–144)

## 2020-03-03 ENCOUNTER — Other Ambulatory Visit (HOSPITAL_COMMUNITY)
Admission: RE | Admit: 2020-03-03 | Discharge: 2020-03-03 | Disposition: A | Discharge status: Home / Self Care | Payer: 59 | Source: Ambulatory Visit | Attending: Cardiology | Admitting: Cardiology

## 2020-03-03 DIAGNOSIS — Z20822 Contact with and (suspected) exposure to covid-19: Secondary | ICD-10-CM | POA: Insufficient documentation

## 2020-03-03 DIAGNOSIS — Z01812 Encounter for preprocedural laboratory examination: Secondary | ICD-10-CM | POA: Diagnosis present

## 2020-03-03 LAB — SARS CORONAVIRUS 2 (TAT 6-24 HRS): SARS Coronavirus 2: NEGATIVE

## 2020-03-04 ENCOUNTER — Telehealth: Payer: Self-pay | Admitting: *Deleted

## 2020-03-04 NOTE — Telephone Encounter (Signed)
Pt contacted pre-catheterization scheduled at [facility] for: Wednesday March 05, 2020 8:30 AM Verified arrival time and place: [facility]) at: 6:30 AM   No solid food after midnight prior to cath, clear liquids until 5 AM day of procedure.  AM meds can be  taken pre-cath with sips of water including: ASA 81 mg   Confirmed patient has responsible adult to drive home post procedure and be with patient first 24 hours after arriving home: yes  You are allowed ONE visitor in the waiting room during the time you are at the hospital for your procedure. Both you and your visitor must wear a mask once you enter the hospital.    Reviewed procedure/mask/visitor instructions reviewed with patient.

## 2020-03-05 ENCOUNTER — Other Ambulatory Visit: Payer: Self-pay

## 2020-03-05 ENCOUNTER — Ambulatory Visit (HOSPITAL_COMMUNITY)
Admission: RE | Admit: 2020-03-05 | Discharge: 2020-03-05 | Disposition: A | Discharge status: Home / Self Care | Payer: 59 | Attending: Cardiology | Admitting: Cardiology

## 2020-03-05 ENCOUNTER — Encounter (HOSPITAL_COMMUNITY)
Admission: RE | Disposition: A | Discharge status: Home / Self Care | Payer: Self-pay | Source: Home / Self Care | Attending: Cardiology

## 2020-03-05 ENCOUNTER — Encounter (HOSPITAL_COMMUNITY): Payer: Self-pay | Admitting: Cardiology

## 2020-03-05 DIAGNOSIS — F17210 Nicotine dependence, cigarettes, uncomplicated: Secondary | ICD-10-CM | POA: Diagnosis not present

## 2020-03-05 DIAGNOSIS — Z888 Allergy status to other drugs, medicaments and biological substances status: Secondary | ICD-10-CM | POA: Diagnosis not present

## 2020-03-05 DIAGNOSIS — E782 Mixed hyperlipidemia: Secondary | ICD-10-CM | POA: Diagnosis not present

## 2020-03-05 DIAGNOSIS — I209 Angina pectoris, unspecified: Secondary | ICD-10-CM | POA: Clinically undetermined

## 2020-03-05 DIAGNOSIS — Z8249 Family history of ischemic heart disease and other diseases of the circulatory system: Secondary | ICD-10-CM | POA: Diagnosis not present

## 2020-03-05 DIAGNOSIS — Z720 Tobacco use: Secondary | ICD-10-CM | POA: Diagnosis present

## 2020-03-05 DIAGNOSIS — I208 Other forms of angina pectoris: Secondary | ICD-10-CM

## 2020-03-05 DIAGNOSIS — Z7982 Long term (current) use of aspirin: Secondary | ICD-10-CM | POA: Insufficient documentation

## 2020-03-05 DIAGNOSIS — Z880 Allergy status to penicillin: Secondary | ICD-10-CM | POA: Insufficient documentation

## 2020-03-05 DIAGNOSIS — Z79899 Other long term (current) drug therapy: Secondary | ICD-10-CM | POA: Insufficient documentation

## 2020-03-05 DIAGNOSIS — Z885 Allergy status to narcotic agent status: Secondary | ICD-10-CM | POA: Diagnosis not present

## 2020-03-05 DIAGNOSIS — I200 Unstable angina: Secondary | ICD-10-CM | POA: Diagnosis not present

## 2020-03-05 HISTORY — PX: LEFT HEART CATH AND CORONARY ANGIOGRAPHY: CATH118249

## 2020-03-05 SURGERY — LEFT HEART CATH AND CORONARY ANGIOGRAPHY
Anesthesia: LOCAL

## 2020-03-05 MED ORDER — LIDOCAINE HCL (PF) 1 % IJ SOLN
INTRAMUSCULAR | Status: DC | PRN
  Administered 2020-03-05: 2 mL

## 2020-03-05 MED ORDER — LABETALOL HCL 5 MG/ML IV SOLN: 10.0000 mg | INTRAVENOUS | Status: DC | PRN

## 2020-03-05 MED ORDER — ASPIRIN 81 MG PO CHEW: 81.0000 mg | CHEWABLE_TABLET | ORAL | Status: DC

## 2020-03-05 MED ORDER — ACETAMINOPHEN 325 MG PO TABS: 650.0000 mg | ORAL_TABLET | ORAL | Status: DC | PRN

## 2020-03-05 MED ORDER — IOHEXOL 350 MG/ML SOLN
INTRAVENOUS | Status: DC | PRN
  Administered 2020-03-05: 35 mL via INTRA_ARTERIAL

## 2020-03-05 MED ORDER — FENTANYL CITRATE (PF) 100 MCG/2ML IJ SOLN
INTRAMUSCULAR | Status: DC | PRN
  Administered 2020-03-05 (×3): 25 ug via INTRAVENOUS

## 2020-03-05 MED ORDER — SODIUM CHLORIDE 0.9 % IV SOLN: 250.0000 mL | INTRAVENOUS | Status: DC | PRN

## 2020-03-05 MED ORDER — VERAPAMIL HCL 2.5 MG/ML IV SOLN
INTRAVENOUS | Status: AC
  Filled 2020-03-05: qty 2

## 2020-03-05 MED ORDER — HEPARIN SODIUM (PORCINE) 1000 UNIT/ML IJ SOLN
INTRAMUSCULAR | Status: DC | PRN
  Administered 2020-03-05: 3000 [IU] via INTRAVENOUS

## 2020-03-05 MED ORDER — ONDANSETRON HCL 4 MG/2ML IJ SOLN: 4.0000 mg | Freq: Four times a day (QID) | INTRAMUSCULAR | Status: DC | PRN

## 2020-03-05 MED ORDER — HEPARIN (PORCINE) IN NACL 1000-0.9 UT/500ML-% IV SOLN
INTRAVENOUS | Status: AC
  Filled 2020-03-05: qty 1000

## 2020-03-05 MED ORDER — SODIUM CHLORIDE 0.9 % WEIGHT BASED INFUSION: 1.0000 mL/kg/h | INTRAVENOUS | Status: DC

## 2020-03-05 MED ORDER — SODIUM CHLORIDE 0.9% FLUSH: 3.0000 mL | Freq: Two times a day (BID) | INTRAVENOUS | Status: DC

## 2020-03-05 MED ORDER — HYDRALAZINE HCL 20 MG/ML IJ SOLN: 10.0000 mg | INTRAMUSCULAR | Status: DC | PRN

## 2020-03-05 MED ORDER — FENTANYL CITRATE (PF) 100 MCG/2ML IJ SOLN
INTRAMUSCULAR | Status: AC
  Filled 2020-03-05: qty 2

## 2020-03-05 MED ORDER — LIDOCAINE HCL (PF) 1 % IJ SOLN
INTRAMUSCULAR | Status: AC
  Filled 2020-03-05: qty 30

## 2020-03-05 MED ORDER — SODIUM CHLORIDE 0.9% FLUSH: 3.0000 mL | INTRAVENOUS | Status: DC | PRN

## 2020-03-05 MED ORDER — SODIUM CHLORIDE 0.9 % WEIGHT BASED INFUSION
3.0000 mL/kg/h | INTRAVENOUS | Status: AC
  Administered 2020-03-05: 3 mL/kg/h via INTRAVENOUS

## 2020-03-05 MED ORDER — HEPARIN SODIUM (PORCINE) 1000 UNIT/ML IJ SOLN
INTRAMUSCULAR | Status: AC
  Filled 2020-03-05: qty 1

## 2020-03-05 MED ORDER — HEPARIN (PORCINE) IN NACL 1000-0.9 UT/500ML-% IV SOLN
INTRAVENOUS | Status: DC | PRN
  Administered 2020-03-05 (×2): 500 mL

## 2020-03-05 MED ORDER — MIDAZOLAM HCL 2 MG/2ML IJ SOLN
INTRAMUSCULAR | Status: DC | PRN
  Administered 2020-03-05 (×3): 1 mg via INTRAVENOUS

## 2020-03-05 MED ORDER — VERAPAMIL HCL 2.5 MG/ML IV SOLN
INTRAVENOUS | Status: DC | PRN
  Administered 2020-03-05: 10 mL via INTRA_ARTERIAL

## 2020-03-05 MED ORDER — MIDAZOLAM HCL 2 MG/2ML IJ SOLN
INTRAMUSCULAR | Status: AC
  Filled 2020-03-05: qty 2

## 2020-03-05 MED ORDER — SODIUM CHLORIDE 0.9 % IV SOLN: INTRAVENOUS | Status: DC

## 2020-03-05 SURGICAL SUPPLY — 13 items
BAG SNAP BAND KOVER 36X36 (MISCELLANEOUS) ×1 IMPLANT
CATH OPTITORQUE TIG 4.0 5F (CATHETERS) ×2 IMPLANT
COVER DOME SNAP 22 D (MISCELLANEOUS) ×1 IMPLANT
DEVICE RAD TR BAND REGULAR (VASCULAR PRODUCTS) ×1 IMPLANT
GLIDESHEATH SLEND SS 6F .021 (SHEATH) ×1 IMPLANT
GUIDEWIRE INQWIRE 1.5J.035X260 (WIRE) ×1 IMPLANT
INQWIRE 1.5J .035X260CM (WIRE) ×2
KIT HEART LEFT (KITS) ×2 IMPLANT
PACK CARDIAC CATHETERIZATION (CUSTOM PROCEDURE TRAY) ×2 IMPLANT
SYR MEDRAD MARK 7 150ML (SYRINGE) ×2 IMPLANT
TRANSDUCER W/STOPCOCK (MISCELLANEOUS) ×2 IMPLANT
TUBING CIL FLEX 10 FLL-RA (TUBING) ×2 IMPLANT
WIRE HI TORQ VERSACORE-J 145CM (WIRE) ×1 IMPLANT

## 2020-03-05 NOTE — Discharge Instructions (Signed)
Radial Site Care  This sheet gives you information about how to care for yourself after your procedure. Your health care provider may also give you more specific instructions. If you have problems or questions, contact your health care provider. What can I expect after the procedure? After the procedure, it is common to have:  Bruising and tenderness at the catheter insertion area. Follow these instructions at home: Medicines  Take over-the-counter and prescription medicines only as told by your health care provider. Insertion site care  Follow instructions from your health care provider about how to take care of your insertion site. Make sure you: ? Wash your hands with soap and water before you change your bandage (dressing). If soap and water are not available, use hand sanitizer. ? Change your dressing as told by your health care provider. ? Leave stitches (sutures), skin glue, or adhesive strips in place. These skin closures may need to stay in place for 2 weeks or longer. If adhesive strip edges start to loosen and curl up, you may trim the loose edges. Do not remove adhesive strips completely unless your health care provider tells you to do that.  Check your insertion site every day for signs of infection. Check for: ? Redness, swelling, or pain. ? Fluid or blood. ? Pus or a bad smell. ? Warmth.  Do not take baths, swim, or use a hot tub until your health care provider approves.  You may shower 24-48 hours after the procedure, or as directed by your health care provider. ? Remove the dressing and gently wash the site with plain soap and water. ? Pat the area dry with a clean towel. ? Do not rub the site. That could cause bleeding.  Do not apply powder or lotion to the site. Activity  For 24 hours after the procedure, or as directed by your health care provider: ? Do not flex or bend the affected arm. ? Do not push or pull heavy objects with the affected arm. ? Do not drive  yourself home from the hospital or clinic. You may drive 24 hours after the procedure unless your health care provider tells you not to. ? Do not operate machinery or power tools.  Do not lift anything that is heavier than 10 lb (4.5 kg), or the limit that you are told, until your health care provider says that it is safe.  Ask your health care provider when it is okay to: ? Return to work or school. ? Resume usual physical activities or sports. ? Resume sexual activity.   General instructions  If the catheter site starts to bleed, raise your arm and put firm pressure on the site. If the bleeding does not stop, get help right away. This is a medical emergency.  If you went home on the same day as your procedure, a responsible adult should be with you for the first 24 hours after you arrive home.  Keep all follow-up visits as told by your health care provider. This is important. Contact a health care provider if:  You have a fever.  You have redness, swelling, or yellow drainage around your insertion site. Get help right away if:  You have unusual pain at the radial site.  The catheter insertion area swells very fast.  The insertion area is bleeding, and the bleeding does not stop when you hold steady pressure on the area.  Your arm or hand becomes pale, cool, tingly, or numb. These symptoms may represent a serious   problem that is an emergency. Do not wait to see if the symptoms will go away. Get medical help right away. Call your local emergency services (911 in the [place] Do not drive yourself to the hospital. Summary  After the procedure, it is common to have bruising and tenderness at the site.  Follow instructions from your health care provider about how to take care of your radial site wound. Check the wound every day for signs of infection.  Do not lift anything that is heavier than 10 lb (4.5 kg), or the limit that you are told, until your health care provider says that it  is safe. This information is not intended to replace advice given to you by your health care provider. Make sure you discuss any questions you have with your health care provider. Document Revised: 02/09/2017 Document Reviewed: 02/09/2017 Elsevier Patient Education  2021 [employer].  

## 2020-03-05 NOTE — Interval H&P Note (Signed)
History and Physical Interval Note:  03/05/2020 8:46 AM  Molly Ellis  has presented today for surgery, with the diagnosis of chest pain - concerning for progressive angina.  The various methods of treatment have been discussed with the patient and family. After consideration of risks, benefits and other options for treatment, the patient has consented to  Procedure(s): LEFT HEART CATH AND CORONARY ANGIOGRAPHY (N/A)  PERCUTANEOUS CORONARY INTERVENTION  as a surgical intervention.  The patient's history has been reviewed, patient examined, no change in status, stable for surgery.  I have reviewed the patient's chart and labs.  Questions were answered to the patient's satisfaction.    Cath Lab Visit (complete for each Cath Lab visit)  Clinical Evaluation Leading to the Procedure:   ACS: No.  Non-ACS:    Anginal Classification: CCS III  Anti-ischemic medical therapy: Minimal Therapy (1 class of medications)  Non-Invasive Test Results: No non-invasive testing performed  Prior CABG: No previous CABG   Glenetta Hew

## 2020-03-10 ENCOUNTER — Ambulatory Visit (INDEPENDENT_AMBULATORY_CARE_PROVIDER_SITE_OTHER): Payer: 59 | Admitting: Cardiology

## 2020-03-10 ENCOUNTER — Ambulatory Visit (INDEPENDENT_AMBULATORY_CARE_PROVIDER_SITE_OTHER): Payer: 59

## 2020-03-10 ENCOUNTER — Other Ambulatory Visit: Payer: Self-pay

## 2020-03-10 ENCOUNTER — Encounter: Payer: Self-pay | Admitting: Cardiology

## 2020-03-10 VITALS — BP 120/80 | HR 52 | Ht 66.0 in | Wt 105.2 lb

## 2020-03-10 DIAGNOSIS — E782 Mixed hyperlipidemia: Secondary | ICD-10-CM | POA: Diagnosis not present

## 2020-03-10 DIAGNOSIS — Z720 Tobacco use: Secondary | ICD-10-CM

## 2020-03-10 DIAGNOSIS — E7800 Pure hypercholesterolemia, unspecified: Secondary | ICD-10-CM

## 2020-03-10 DIAGNOSIS — R079 Chest pain, unspecified: Secondary | ICD-10-CM

## 2020-03-10 DIAGNOSIS — I4510 Unspecified right bundle-branch block: Secondary | ICD-10-CM

## 2020-03-10 DIAGNOSIS — I219 Acute myocardial infarction, unspecified: Secondary | ICD-10-CM | POA: Diagnosis not present

## 2020-03-10 DIAGNOSIS — I209 Angina pectoris, unspecified: Secondary | ICD-10-CM

## 2020-03-10 NOTE — Progress Notes (Signed)
Complete echocardiogram performed.  Jimmy Deepti Gunawan RDCS, RVT  

## 2020-03-10 NOTE — Patient Instructions (Signed)

## 2020-03-10 NOTE — Progress Notes (Signed)
Cardiology Office Note:    Date:  03/10/2020   ID:  Molly Ellis, DOB July 30, 1970, MRN [id number]  PCP:  Angelina Sheriff, MD  Cardiologist:  Jenne Campus, MD    Referring MD: Angelina Sheriff, MD   Chief Complaint  Patient presents with  . Follow-up  Had a cardiac catheterization which was normal  History of Present Illness:    Molly Ellis is a 50 y.o. female with past medical history significant for atypical chest pain in 2015 at that time she ended going to the hospital was told to have a small myocardial infarction.  After that she presented to our office for evaluation of atypical chest pain.  She eventually ended up having cardiac catheterization which was done just few weeks ago which showed normal coronaries.  She comes today to talk about it.  She still complains of some chest pain which can last all day.  It is very mild sensation she said 2-3 and scale up to 10.  She was given nitroglycerin but did not have times to use it.  Sadly she still continues to smoke few cigarettes and I told her she must quit.  Past Medical History:  Diagnosis Date  . 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 Chest pain of uncertain etiology 84/69/6295  . Family history of Graves' disease 11/04/2017  . Heart attack ([place]) 01/2014  . History of removal of ovarian cyst 11/04/2017  . Hypercholesterolemia 12/10/2013  . Kidney stones   . Kidney stones   . Migraine 12/10/2013  . Mixed hyperlipidemia 02/25/2020  . RBBB 12/10/2013  . 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 Tobacco abuse 12/10/2013    Past Surgical History:  Procedure Laterality Date  . CARDIAC CATHETERIZATION    . CHOLECYSTECTOMY    . ENDOMETRIAL ABLATION    . LAPAROSCOPIC OVARIAN CYSTECTOMY    . LEFT HEART CATH AND CORONARY ANGIOGRAPHY N/A 03/05/2020   Procedure: LEFT HEART CATH AND CORONARY ANGIOGRAPHY;  Surgeon: Leonie Man, MD;  Location: [facility];   Service: Cardiovascular;  Laterality: N/A;  . TUBAL LIGATION      Current Medications: Current Meds  Medication Sig  . ALPRAZolam (XANAX) 0.25 MG tablet Take 0.25 mg by mouth 2 (two) times daily as needed (Headache).  Marland Kitchen aspirin EC 81 MG tablet Take 81 mg by mouth daily. Swallow whole.  . cetirizine (ZYRTEC) 10 MG tablet Take 10 mg by mouth daily.   . fluticasone (FLONASE) 50 MCG/ACT nasal spray Place 1 spray into both nostrils daily as needed for allergies.  . Multiple Vitamins-Minerals (MULTIVITAMIN WITH MINERALS) tablet Take 1 tablet by mouth 3 (three) times a week. olly  . nitroGLYCERIN (NITROSTAT) 0.4 MG SL tablet Place 1 tablet (0.4 mg total) under the tongue every 5 (five) minutes as needed for chest pain. Max 3 doses  . ondansetron (ZOFRAN) 4 MG tablet Take 4 mg by mouth every 8 (eight) hours as needed for nausea or vomiting.  Marland Kitchen OVER THE COUNTER MEDICATION Take 20 mg by mouth daily as needed (Pain). delta 8 gummies  . rosuvastatin (CRESTOR) 5 MG tablet Take 5 mg by mouth daily.     Allergies:   Penicillins, Codeine, Cymbalta [duloxetine hcl], and Other   Social History   Socioeconomic History  . Marital status: Single    Spouse name: Not on file  . Number of children: Not on file  . Years of education: Not on file  .  Highest education level: Not on file  Occupational History  . Not on file  Tobacco Use  . Smoking status: Former Smoker    Packs/day: 0.00    Types: Cigarettes    Quit date: 03/03/2020    Years since quitting: 0.0  . Smokeless tobacco: Never Used  Vaping Use  . Vaping Use: Never used  Substance and Sexual Activity  . Alcohol use: Yes    Alcohol/week: 1.0 standard drink    Types: 1 Cans of beer per week    Comment: occ  . Drug use: Yes    Types: Marijuana  . Sexual activity: Yes    Birth control/protection: Surgical  Other Topics Concern  . Not on file  Social History Narrative  . Not on file   Social Determinants of Health   Financial Resource  Strain: Not on file  Food Insecurity: Not on file  Transportation Needs: Not on file  Physical Activity: Not on file  Stress: Not on file  Social Connections: Not on file     Family History: The patient's family history includes Asthma in her mother; Heart attack in her maternal grandfather; Heart failure in her maternal grandfather; Hypertension in her father and mother; Prostate cancer in her maternal uncle; Stroke in her mother. There is no history of Breast cancer, Ovarian cancer, or Colon cancer. ROS:   Please see the history of present illness.    All 14 point review of systems negative except as described per history of present illness  EKGs/Labs/Other Studies Reviewed:      Recent Labs: 02/25/2020: BUN 12; Creatinine, Ser 0.79; Hemoglobin 13.9; Platelets 274; Potassium 4.2; Sodium 142  Recent Lipid Panel No results found for: CHOL, TRIG, HDL, CHOLHDL, VLDL, LDLCALC, LDLDIRECT  Physical Exam:    VS:  BP 120/80 (BP Location: Left Arm, Patient Position: Sitting, Cuff Size: Normal)   Pulse (!) 52   Ht 5\' 6"  (1.676 m)   Wt 105 lb 3.2 oz (47.7 kg)   SpO2 97%   BMI 16.98 kg/m     Wt Readings from Last 3 Encounters:  03/10/20 105 lb 3.2 oz (47.7 kg)  03/05/20 108 lb (49 kg)  02/25/20 108 lb (49 kg)     GEN:  Well nourished, well developed in no acute distress HEENT: Normal NECK: No JVD; No carotid bruits LYMPHATICS: No lymphadenopathy CARDIAC: RRR, no murmurs, no rubs, no gallops RESPIRATORY:  Clear to auscultation without rales, wheezing or rhonchi  ABDOMEN: Soft, non-tender, non-distended MUSCULOSKELETAL:  No edema; No deformity  SKIN: Warm and dry LOWER EXTREMITIES: no swelling NEUROLOGIC:  Alert and oriented x 3 PSYCHIATRIC:  Normal affect   ASSESSMENT:    1. Angina, class III ([place])   2. Tobacco abuse   3. Mixed hyperlipidemia   4. Chest pain of uncertain etiology    PLAN:    In order of problems listed above:  1. Chest pain, however cardiac  catheterization showed normal coronaries of course we still can deal with small vessel disease versus spasm.  I asked her to start using nitroglycerin on as-needed basis.  I will not change any of her medications right now.  We will continue to modify her risk factors which include quitting smoking.  I will still continue with aspirin as well as Crestor.  We will schedule her to have fasting lipid profile. 2. Smoking discussed with her in length.  She understands she need to quit. 3. We also talk about healthy lifestyle.  I did review records  including cardiac catheterization with her in details   Medication Adjustments/Labs and Tests Ordered: Current medicines are reviewed at length with the patient today.  Concerns regarding medicines are outlined above.  No orders of the defined types were placed in this encounter.  Medication changes: No orders of the defined types were placed in this encounter.   Signed, Park Liter, MD, [facility] 03/10/2020 4:47 PM    [place]

## 2020-03-11 LAB — ECHOCARDIOGRAM COMPLETE: Area-P 1/2: 2.8 cm2

## 2020-04-07 ENCOUNTER — Ambulatory Visit: Payer: 59 | Admitting: Cardiology

## 2020-04-27 IMAGING — US US PELVIS COMPLETE
1 series · 13 of 25 positions shown · non-contrast
Comparison: Prior CT from earlier the same day.

CLINICAL DATA: Initial evaluation for acute pelvic pain. Ovarian
cyst seen on prior CT.

EXAM:
TRANSABDOMINAL AND TRANSVAGINAL ULTRASOUND OF PELVIS
DOPPLER ULTRASOUND OF OVARIES
TECHNIQUE: Both transabdominal and transvaginal ultrasound examinations of the
pelvis were performed. Transabdominal technique was performed for
global imaging of the pelvis including uterus, ovaries, adnexal
regions, and pelvic cul-de-sac.
It was necessary to proceed with endovaginal exam following the
transabdominal exam to visualize the uterus, endometrium, and
ovaries. Color and duplex Doppler ultrasound was utilized to
evaluate blood flow to the ovaries.

[Series 1: us pelvis complete · 0.19mm/px · 13 of 119 slices shown]
[im 1/119]
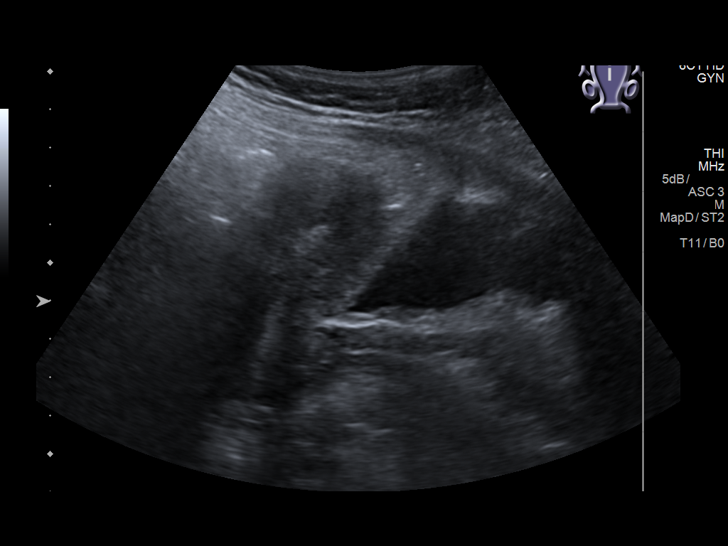
[im 10/119]
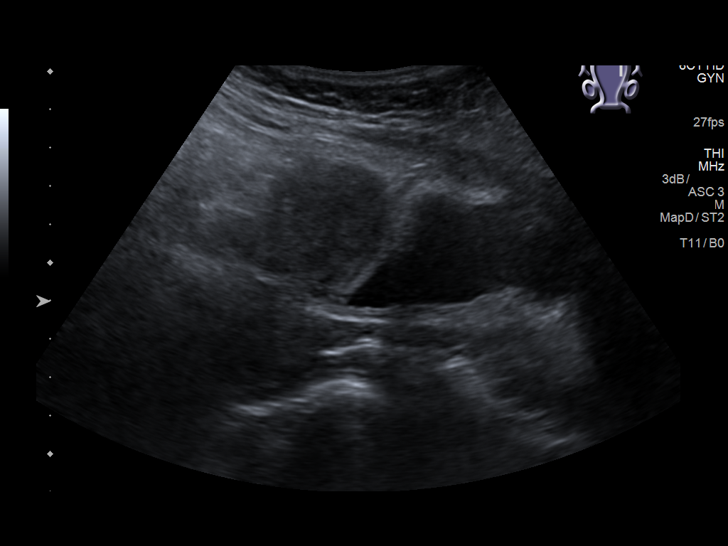
[im 20/119]
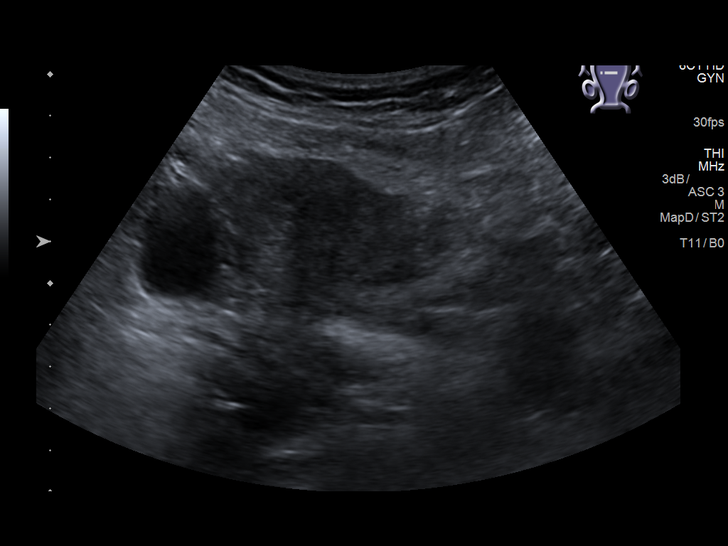
[im 30/119]
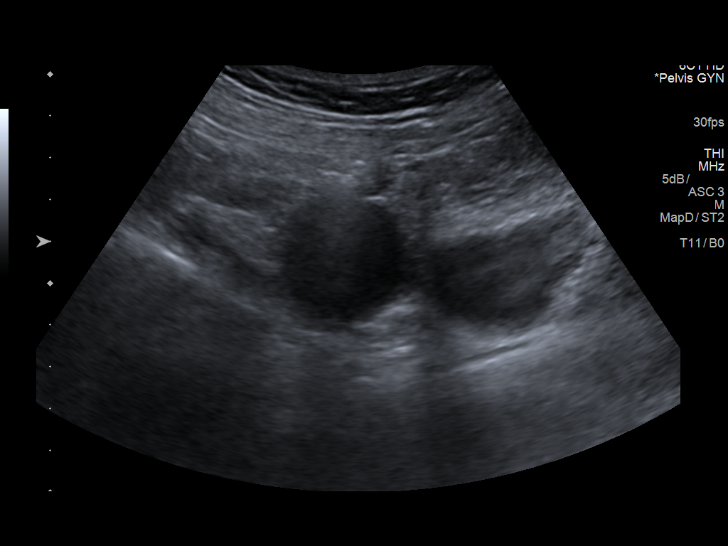
[im 40/119]
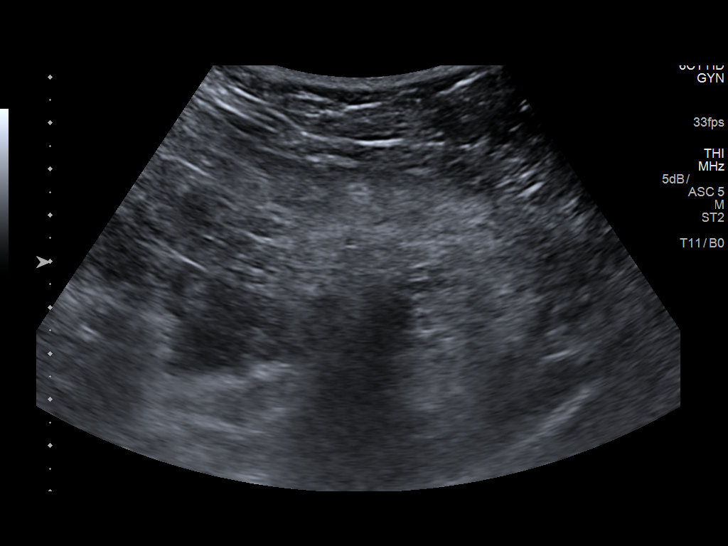
[im 50/119]
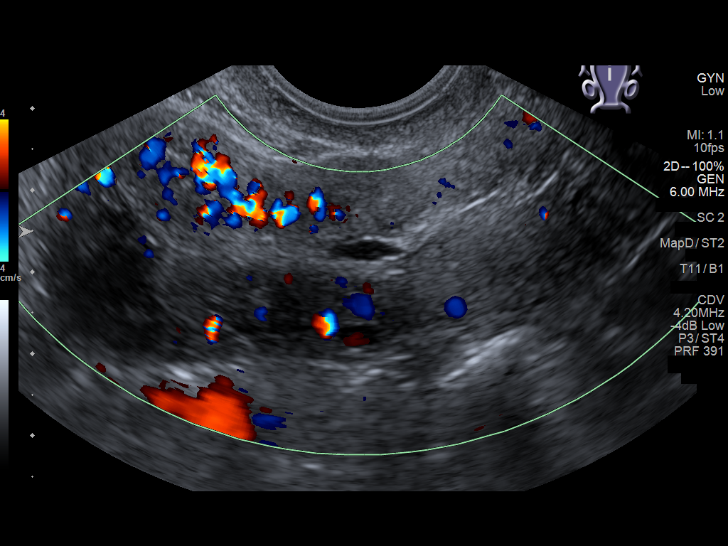
[im 60/119]
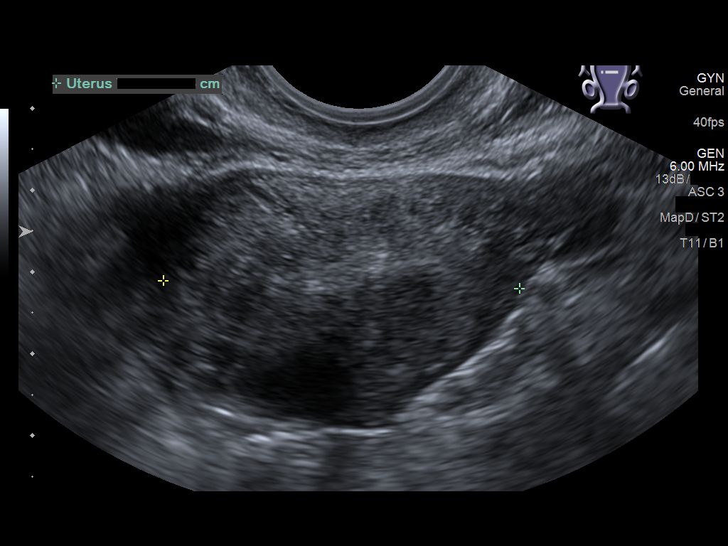
[im 69/119]
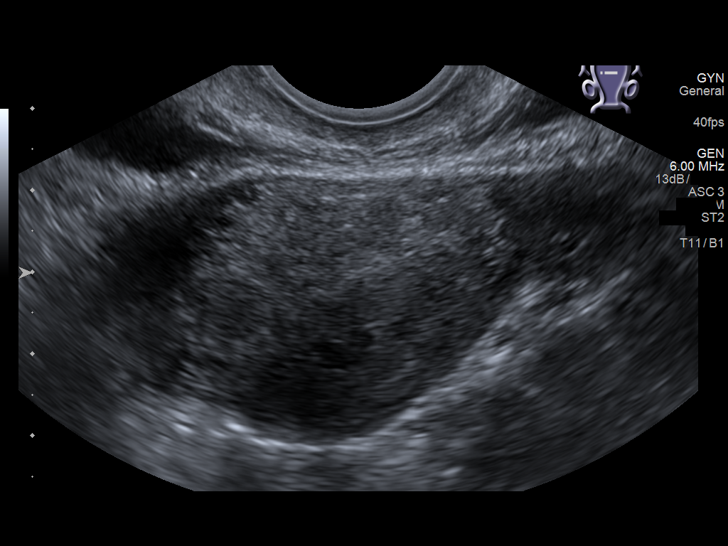
[im 79/119]
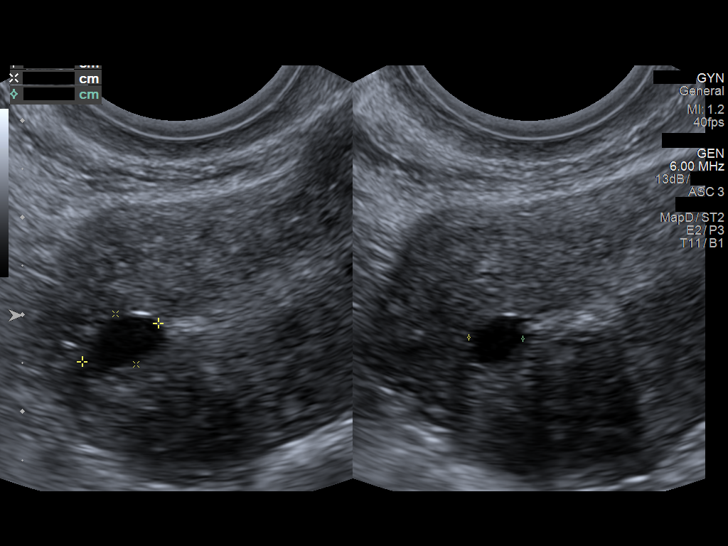
[im 89/119]
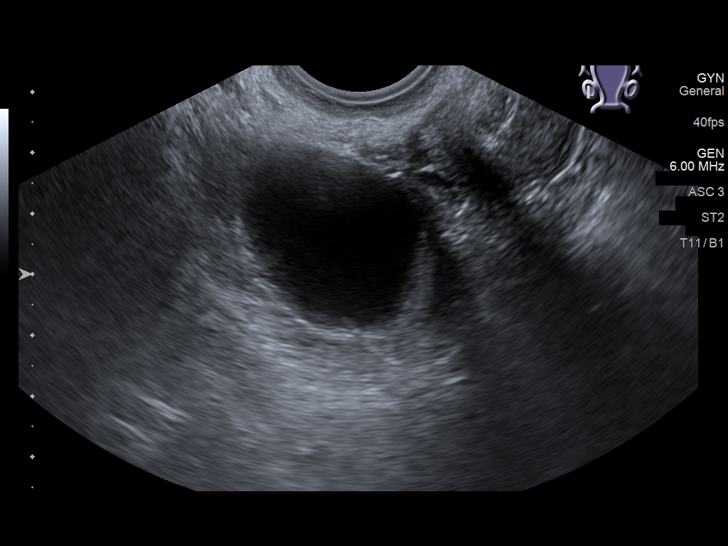
[im 99/119]
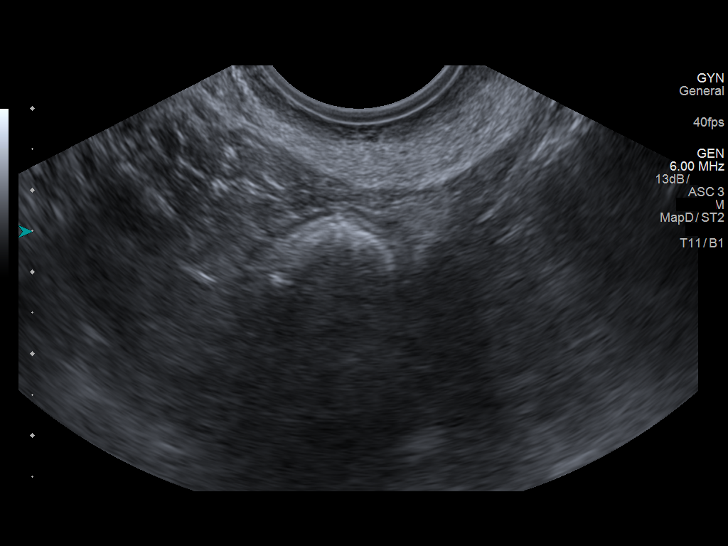
[im 109/119]
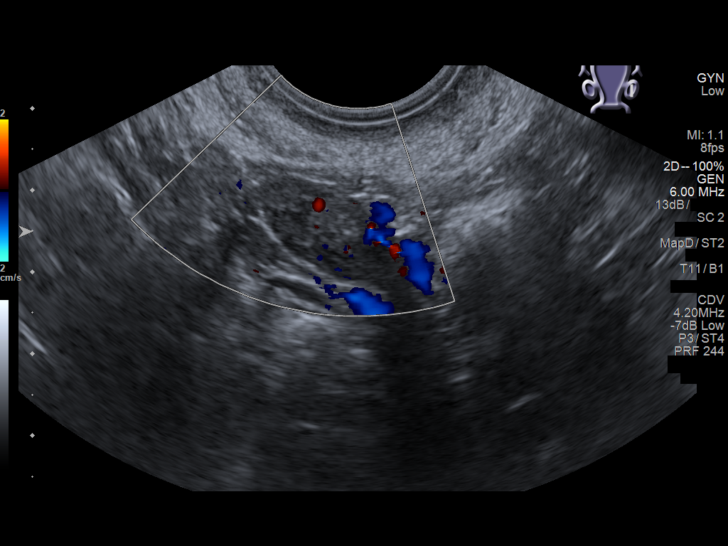
[im 119/119]
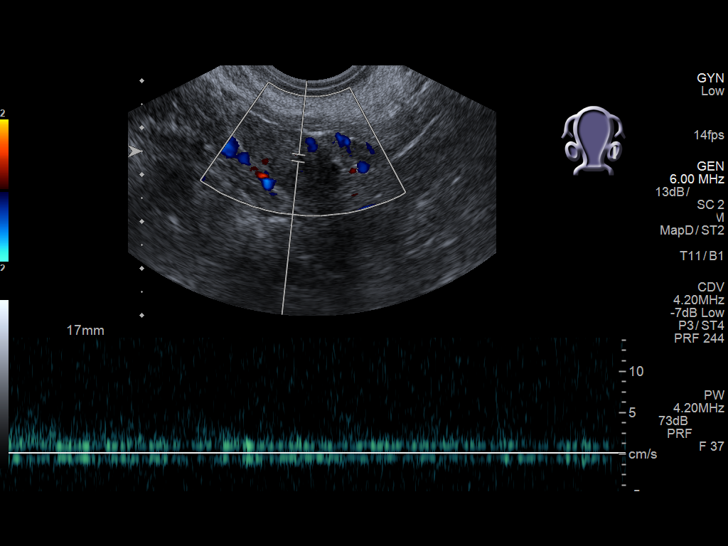

[13 of 25 positions shown; findings below may reference images not displayed]

FINDINGS: Uterus

Measurements: 7.4 x 3.1 x 4.4 cm. 9 x 6 x 6 mm cyst noted at the
central uterine fundus, indeterminate, but possibly reflecting a
small amount of trapped fluid.

Endometrium

Thickness: 6 mm.  No focal abnormality visualized.

Right ovary

Measurements: 3.7 x 3.1 x 3.6 cm. 3.0 x 2.9 x 2.8 cm simple right
renal cyst. No internal vascularity or complexity..

Left ovary

Measurements: 2.2 x 1.2 x 2.1 cm. Normal appearance/no adnexal mass.

Pulsed Doppler evaluation of both ovaries demonstrates normal
low-resistance arterial and venous waveforms.

Other findings

No abnormal free fluid.
IMPRESSION: 1. 3 cm simple right ovarian cyst. This is almost certainly benign,
and no specific imaging follow up is recommended according to the
Society of Radiologists in JltrasoundBQ0Q Consensus Conference
Statement (Diana Magaly Jayariyu et al. Management of Asymptomatic Ovarian and
Other Adnexal Cysts Imaged at US: Society of Radiologists in
7898): [phone].).
2. No other acute abnormality within the pelvis. No evidence for
torsion.

## 2020-06-27 IMAGING — CT CT RENAL STONE PROTOCOL
2 of 4 series · 16 of 46 positions shown, 18 images · non-contrast
Comparison: CT urogram of 04/15/2013

CLINICAL DATA: Right flank pain today, some nausea, history of
kidney stones

EXAM:
CT ABDOMEN AND PELVIS WITHOUT CONTRAST
TECHNIQUE: Multidetector CT imaging of the abdomen and pelvis was performed
following the standard protocol without IV contrast.

[Series 2: stone full standard · axial · 0.66mm/px · z∈[-895,-495]mm · 13 of 88 slices shown, 15 images]
[im 4/88  soft-tissue]
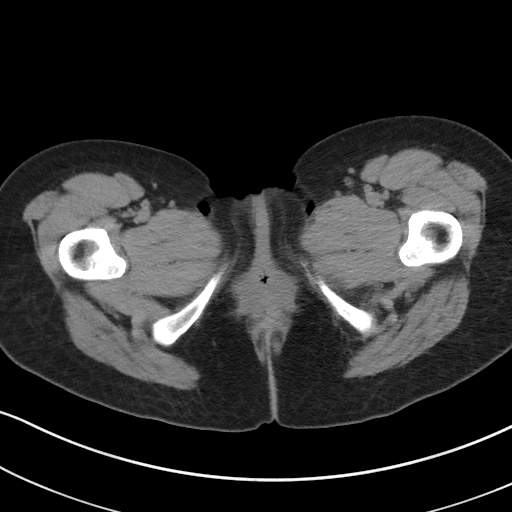
[im 4/88  bone]
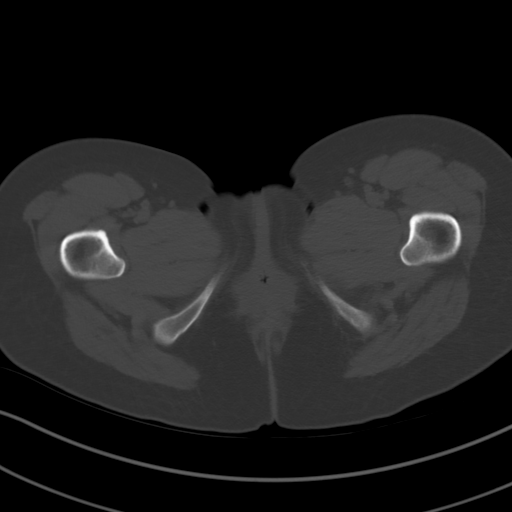
[im 11/88  soft-tissue]
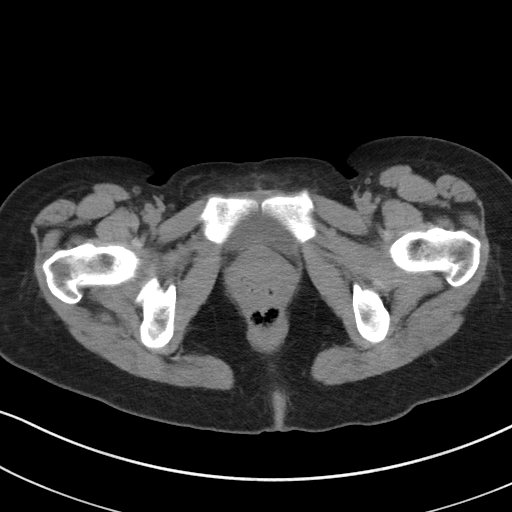
[im 18/88  soft-tissue]
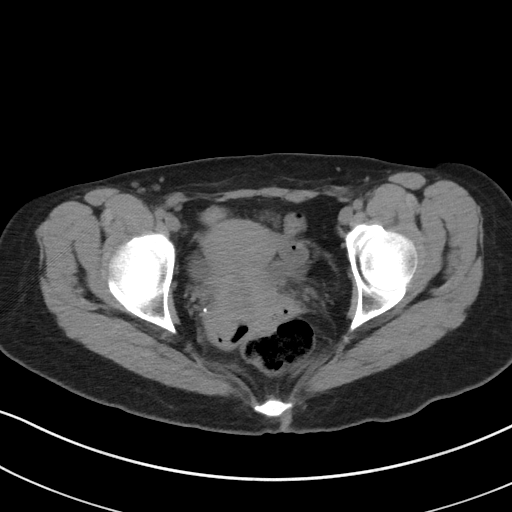
[im 25/88  soft-tissue]
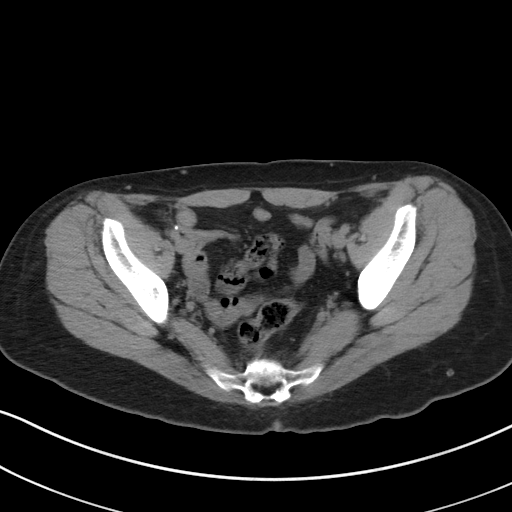
[im 32/88  soft-tissue]
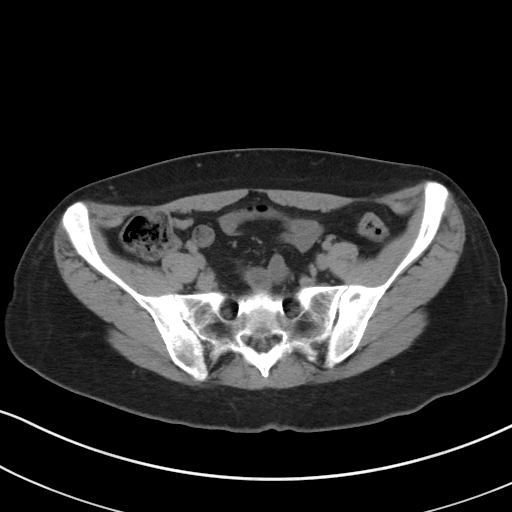
[im 39/88  soft-tissue]
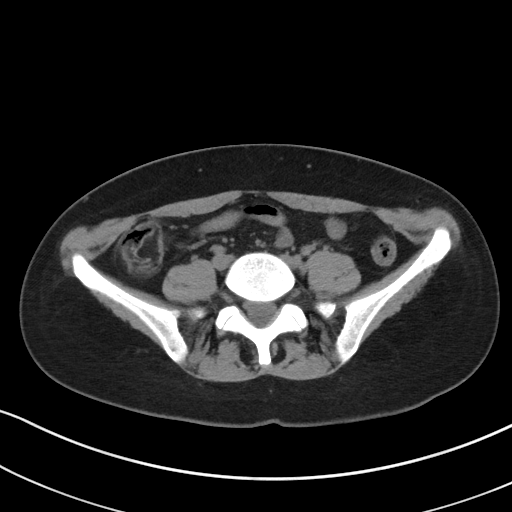
[im 46/88  soft-tissue]
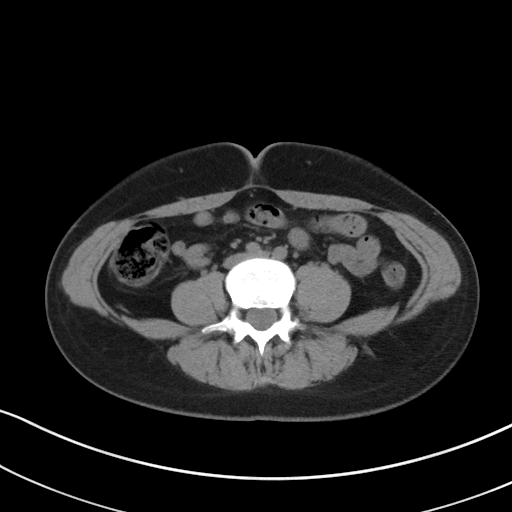
[im 49/88  soft-tissue]
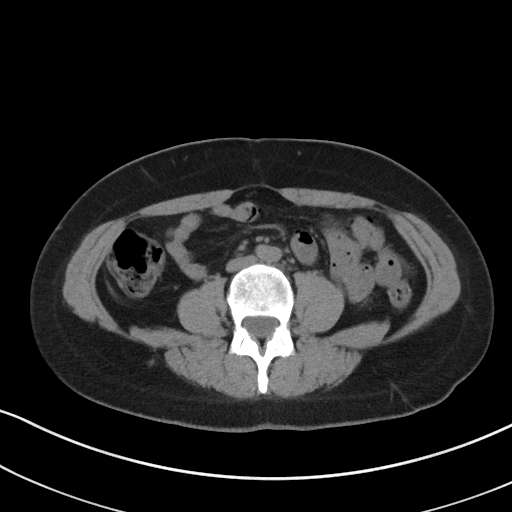
[im 56/88  soft-tissue]
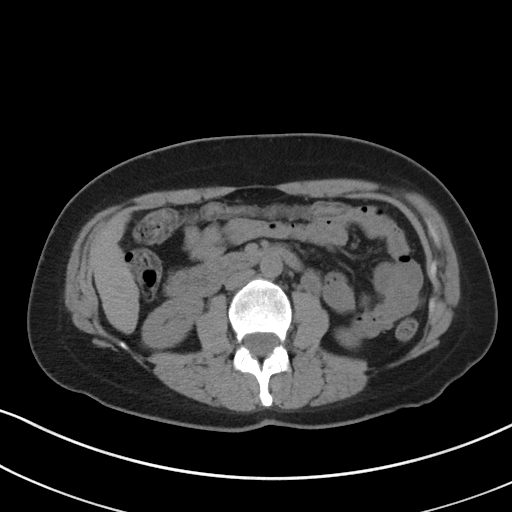
[im 56/88  bone]
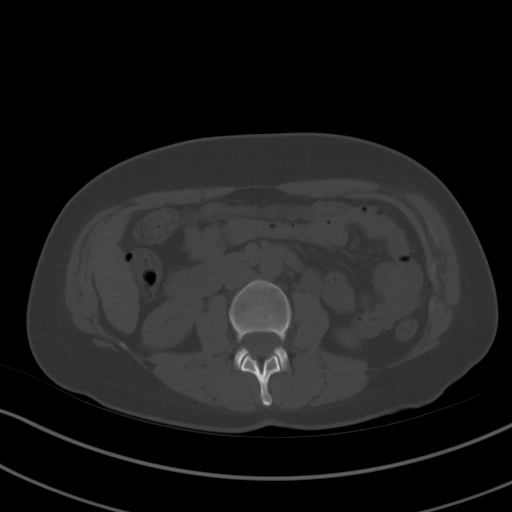
[im 63/88  soft-tissue]
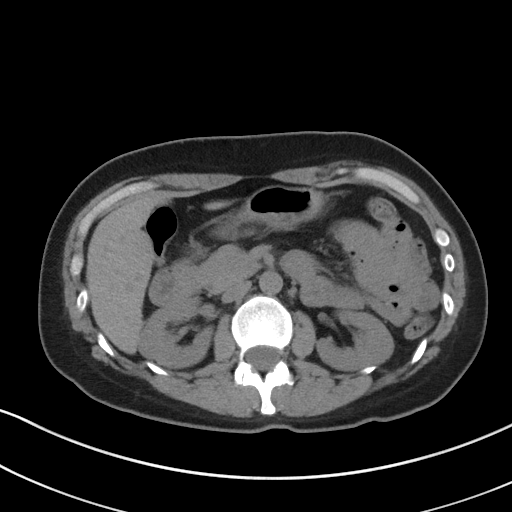
[im 70/88  soft-tissue]
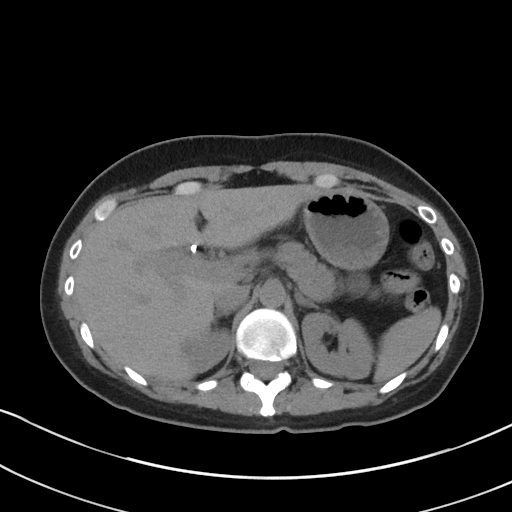
[im 77/88  soft-tissue]
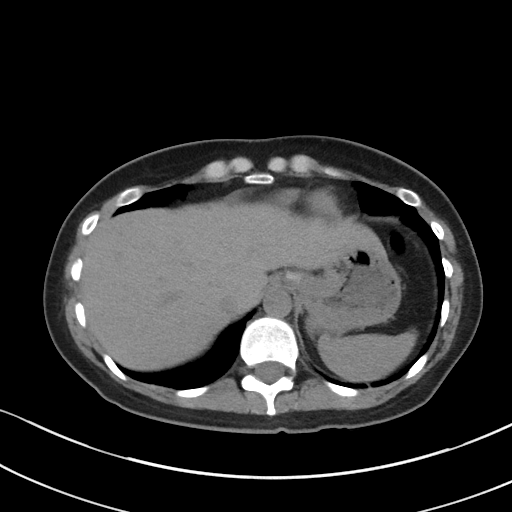
[im 84/88  soft-tissue]
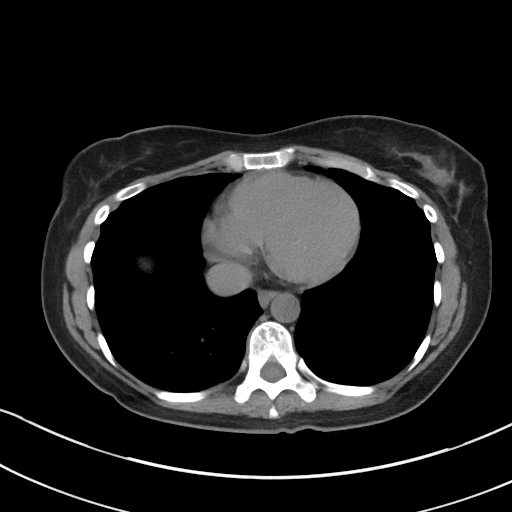

[Series 5: coronal · coronal · 0.74mm/px · 3 of 96 slices shown]
[im 32/96  soft-tissue]
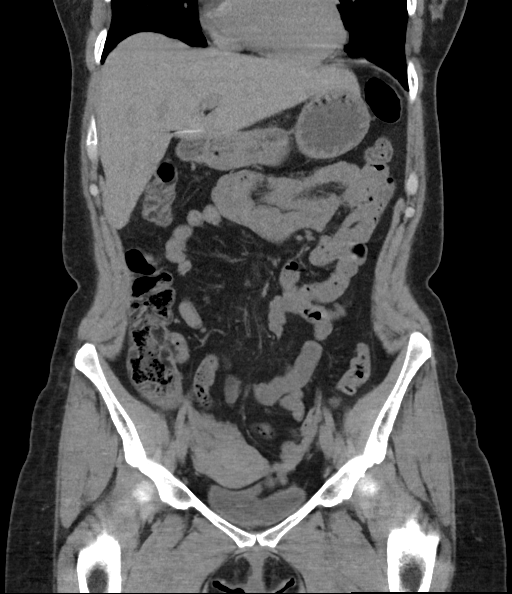
[im 43/96  soft-tissue]
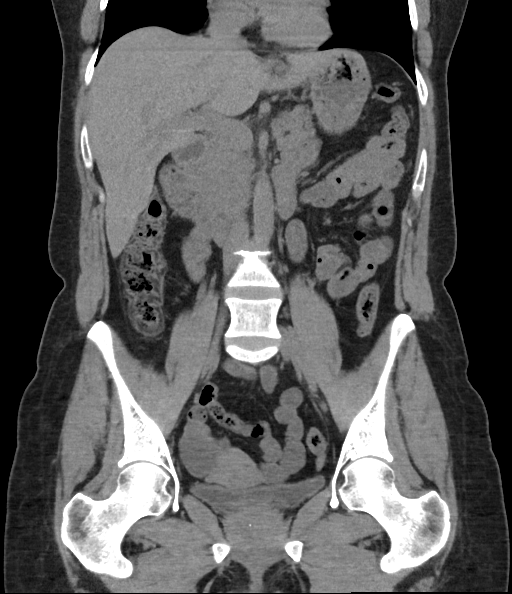
[im 53/96  soft-tissue]
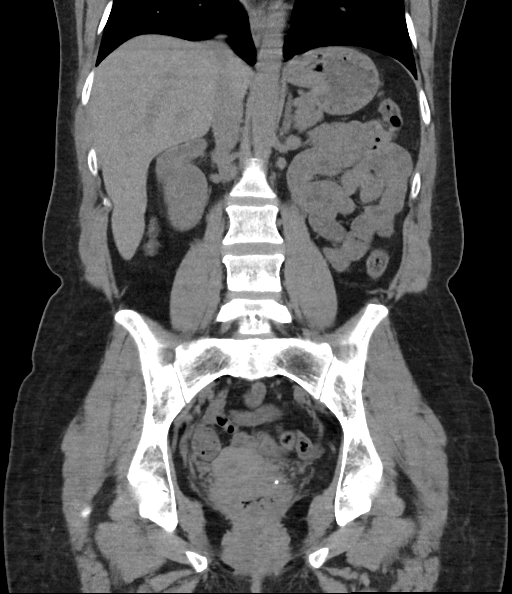

[16 of 46 positions shown; findings below may reference images not displayed]

FINDINGS: Lower chest: The lung bases are clear. The heart is within normal
limits in size.

Hepatobiliary: The liver is unremarkable in the unenhanced state.
Surgical clips are present from prior cholecystectomy.

Pancreas: Pancreas is normal in size. The pancreatic duct is not
dilated.

Spleen: The spleen is unremarkable.

Adrenals/Urinary Tract: The adrenal glands are stable with a
probable small left adrenal adenoma which is unchanged. No renal
calculi are seen and there is no evidence of hydronephrosis. The
ureters appear to be normal in caliber. The urinary bladder is not
well distended but no abnormality is seen.

Stomach/Bowel: The stomach is not well distended but no abnormality
is noted. The small bowel is not distended. The colon is largely
decompressed. The terminal ileum is unremarkable and the appendix
has previously been resected.

Vascular/Lymphatic: The abdominal aorta is normal in caliber. No
adenopathy is seen.

Reproductive: The uterus is normal in size. There are follicles
present and there is a right ovarian cyst of approximately 3.6 x
cm. This may be causing some right pelvic discomfort. No free fluid
is noted within the pelvis.

Other: None.

Musculoskeletal: The lumbar vertebrae are in normal alignment with
normal intervertebral disc spaces. The SI joints are corticated.
IMPRESSION: 1. No renal or ureteral calculi are seen. There is no evidence of
hydronephrosis.
2. 3.6 x 3.0 cm right ovarian cyst may cause some pelvic discomfort.
No free fluid is seen.

## 2020-08-11 ENCOUNTER — Ambulatory Visit: Payer: 59 | Admitting: Cardiology

## 2020-10-21 ENCOUNTER — Emergency Department (HOSPITAL_COMMUNITY)
Admission: EM | Admit: 2020-10-21 | Discharge: 2020-10-22 | Disposition: A | Discharge status: Home / Self Care | Payer: 59 | Attending: Emergency Medicine | Admitting: Emergency Medicine

## 2020-10-21 ENCOUNTER — Encounter (HOSPITAL_COMMUNITY): Payer: Self-pay

## 2020-10-21 ENCOUNTER — Other Ambulatory Visit: Payer: Self-pay

## 2020-10-21 DIAGNOSIS — R6884 Jaw pain: Secondary | ICD-10-CM | POA: Diagnosis present

## 2020-10-21 DIAGNOSIS — Z7982 Long term (current) use of aspirin: Secondary | ICD-10-CM | POA: Diagnosis not present

## 2020-10-21 DIAGNOSIS — M26603 Bilateral temporomandibular joint disorder, unspecified: Secondary | ICD-10-CM | POA: Insufficient documentation

## 2020-10-21 DIAGNOSIS — M26623 Arthralgia of bilateral temporomandibular joint: Secondary | ICD-10-CM

## 2020-10-21 DIAGNOSIS — Z955 Presence of coronary angioplasty implant and graft: Secondary | ICD-10-CM | POA: Insufficient documentation

## 2020-10-21 DIAGNOSIS — Z87891 Personal history of nicotine dependence: Secondary | ICD-10-CM | POA: Insufficient documentation

## 2020-10-21 MED ORDER — NAPROXEN 250 MG PO TABS
500.0000 mg | ORAL_TABLET | Freq: Once | ORAL | Status: AC
  Administered 2020-10-21: 500 mg via ORAL
  Filled 2020-10-21: qty 2

## 2020-10-21 MED ORDER — ONDANSETRON 4 MG PO TBDP
4.0000 mg | ORAL_TABLET | Freq: Once | ORAL | Status: AC
  Administered 2020-10-21: 4 mg via ORAL
  Filled 2020-10-21: qty 1

## 2020-10-21 NOTE — ED Provider Notes (Signed)
Emergency Medicine Provider Triage Evaluation Note  HIMANI CORONA , a 50 y.o. female  was evaluated in triage.  Pt complains of bilateral TMJ pain. Unable to wait to see specialist. Had 45 mins of trismus early this morning. No locking jaw now  Review of Systems  Positive: pain Negative: Trismus   Physical Exam  BP 125/67 (BP Location: Left Arm)   Pulse 79   Temp 98.9 F (37.2 C) (Oral)   Resp 18   SpO2 100%  Gen:   Awake, no distress   Resp:  Normal effort  MSK:   Moves extremities without difficulty  Other:  Opens mouth, no signs of abscess  Medical Decision Making  Medically screening exam initiated at 4:23 PM.  Appropriate orders placed.  CREASIE LACOSSE was informed that the remainder of the evaluation will be completed by another provider, this initial triage assessment does not replace that evaluation, and the importance of remaining in the ED until their evaluation is complete.     Rhae Hammock, PA-C 10/21/20 1625    Lorelle Gibbs, DO 10/22/20 0459

## 2020-10-21 NOTE — ED Triage Notes (Signed)
Pt reports she had an episode of lock jaw about 3 weeks ago and is still having significant pain from it. Scheduled to see a specialist soon.

## 2020-10-22 MED ORDER — CYCLOBENZAPRINE HCL 5 MG PO TABS: 5.0000 mg | ORAL_TABLET | Freq: Two times a day (BID) | ORAL | 0 refills | Status: AC | PRN

## 2020-10-22 NOTE — ED Provider Notes (Signed)
[facility] EMERGENCY DEPARTMENT Provider Note   CSN: [id number] Arrival date & time: 10/21/20  1408     History Chief Complaint  Patient presents with  . Jaw Pain    Molly Ellis is a 50 y.o. female hx of hyperlipidemia, here presenting with TMJ pain.  Patient states that her jaw locked up several times over the last week or so.  Patient had another episode yesterday.  She apparently was referred to a doctor at [facility] but the doctor did not take her insurance.  She was told to come to the ER.  Patient states that it locked up for about 45 minutes yesterday now she is feeling okay.  Denies any ear pain  The history is provided by the patient.      Past Medical History:  Diagnosis Date  . 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 Chest pain of uncertain etiology 44/01/270  . Family history of Graves' disease 11/04/2017  . Heart attack ([place]) 01/2014  . History of removal of ovarian cyst 11/04/2017  . Hypercholesterolemia 12/10/2013  . Kidney stones   . Kidney stones   . Migraine 12/10/2013  . Mixed hyperlipidemia 02/25/2020  . RBBB 12/10/2013  . 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 Tobacco abuse 12/10/2013    Patient Active Problem List   Diagnosis Date Noted  . Family history of premature coronary artery disease 03/05/2020  . 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 Mixed hyperlipidemia 02/25/2020  . Kidney stones   . 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 Family history of Graves' disease 11/04/2017  . History of removal of ovarian cyst 11/04/2017  . Heart attack ([place]) 01/2014  . Chest pain of uncertain etiology 53/66/4403  . RBBB 12/10/2013  . Hypercholesterolemia 12/10/2013  . Migraine 12/10/2013  . Tobacco abuse 12/10/2013    Past Surgical History:  Procedure Laterality Date  . CARDIAC CATHETERIZATION    . CHOLECYSTECTOMY    . ENDOMETRIAL ABLATION    .  LAPAROSCOPIC OVARIAN CYSTECTOMY    . LEFT HEART CATH AND CORONARY ANGIOGRAPHY N/A 03/05/2020   Procedure: LEFT HEART CATH AND CORONARY ANGIOGRAPHY;  Surgeon: Leonie Man, MD;  Location: [facility];  Service: Cardiovascular;  Laterality: N/A;  . TUBAL LIGATION       OB History     Gravida  10   Para  3   Term  0   Preterm  3   AB  7   Living  2      SAB  7   IAB      Ectopic      Multiple      Live Births  2           Family History  Problem Relation Age of Onset  . Asthma Mother   . Hypertension Mother   . Stroke Mother   . Hypertension Father   . Heart failure Maternal Grandfather   . Heart attack Maternal Grandfather   . Prostate cancer Maternal Uncle   . Breast cancer Neg Hx   . Ovarian cancer Neg Hx   . Colon cancer Neg Hx     Social History   Tobacco Use  . Smoking status: Former    Packs/day: 0.00    Types: Cigarettes    Quit date: 03/03/2020    Years since quitting: 0.6  . Smokeless tobacco: Never  Vaping Use  . Vaping  Use: Never used  Substance Use Topics  . Alcohol use: Yes    Alcohol/week: 1.0 standard drink    Types: 1 Cans of beer per week    Comment: occ  . Drug use: Yes    Types: Marijuana    Home Medications Prior to Admission medications   Medication Sig Start Date End Date Taking? Authorizing Provider  cyclobenzaprine (FLEXERIL) 5 MG tablet Take 1 tablet (5 mg total) by mouth 2 (two) times daily as needed. 10/22/20  Yes Drenda Freeze, MD  ALPRAZolam Duanne Moron) 0.25 MG tablet Take 0.25 mg by mouth 2 (two) times daily as needed (Headache).    [provider]  aspirin EC 81 MG tablet Take 81 mg by mouth daily. Swallow whole.    [provider]  cetirizine (ZYRTEC) 10 MG tablet Take 10 mg by mouth daily.     [provider]  fluticasone (FLONASE) 50 MCG/ACT nasal spray Place 1 spray into both nostrils daily as needed for allergies.    [provider]  Multiple Vitamins-Minerals  (MULTIVITAMIN WITH MINERALS) tablet Take 1 tablet by mouth 3 (three) times a week. olly    [provider]  nitroGLYCERIN (NITROSTAT) 0.4 MG SL tablet Place 1 tablet (0.4 mg total) under the tongue every 5 (five) minutes as needed for chest pain. Max 3 doses 02/25/20   Tobb, Kardie, DO  ondansetron (ZOFRAN) 4 MG tablet Take 4 mg by mouth every 8 (eight) hours as needed for nausea or vomiting.    [provider]  OVER THE COUNTER MEDICATION Take 20 mg by mouth daily as needed (Pain). delta 8 gummies    [provider]  rosuvastatin (CRESTOR) 5 MG tablet Take 5 mg by mouth daily.    [provider]    Allergies    Penicillins, Codeine, Cymbalta [duloxetine hcl], and Other  Review of Systems   Review of Systems  HENT:         Jaw pain  All other systems reviewed and are negative.  Physical Exam Updated Vital Signs BP (!) 124/98   Pulse (!) 50   Temp 98.9 F (37.2 C) (Oral)   Resp 16   SpO2 100%   Physical Exam Vitals and nursing note reviewed.  Constitutional:      Appearance: Normal appearance.  HENT:     Head: Normocephalic.     Left Ear: Tympanic membrane normal.     Nose: Nose normal.     Mouth/Throat:     Mouth: Mucous membranes are moist.     Comments: Dentures are present.  Patient is able to open and close the jaw.  There is no obvious TMJ dislocation.  Eyes:     Extraocular Movements: Extraocular movements intact.     Pupils: Pupils are equal, round, and reactive to light.  Cardiovascular:     Rate and Rhythm: Normal rate and regular rhythm.     Pulses: Normal pulses.     Heart sounds: Normal heart sounds.  Pulmonary:     Effort: Pulmonary effort is normal.     Breath sounds: Normal breath sounds.  Abdominal:     General: Abdomen is flat.     Palpations: Abdomen is soft.  Musculoskeletal:        General: Normal range of motion.     Cervical back: Normal range of motion and neck supple.  Skin:    General: Skin is warm.      Capillary Refill: Capillary refill takes less than  2 seconds.  Neurological:     General: No focal deficit present.     Mental Status: She is alert and oriented to person, place, and time.  Psychiatric:        Mood and Affect: Mood normal.        Behavior: Behavior normal.    ED Results / Procedures / Treatments   Labs (all labs ordered are listed, but only abnormal results are displayed) Labs Reviewed - No data to display  EKG None  Radiology No results found.  Procedures Procedures   Medications Ordered in ED Medications  naproxen (NAPROSYN) tablet 500 mg (500 mg Oral Given 10/21/20 1456)  ondansetron (ZOFRAN-ODT) disintegrating tablet 4 mg (4 mg Oral Given 10/21/20 1456)    ED Course  I have reviewed the triage vital signs and the nursing notes.  Pertinent labs & imaging results that were available during my care of the patient were reviewed by me and considered in my medical decision making (see chart for details).    MDM Rules/Calculators/A&P                          Molly Ellis is a 50 y.o. female here presenting with TMJ pain.  It is unclear if she has recurrent TMJ dislocations or she just has muscle spasm.  Currently, patient is able to talk and move her jaw.  She has no pain right now.  I told her that she should call oral surgery in the morning to arrange for follow-up.  Her primary care doctor may also need to refer for her to another specialist in her network. No further workup in the ED necessary currently.    Final Clinical Impression(s) / ED Diagnoses Final diagnoses:  Bilateral temporomandibular joint pain    Rx / DC Orders ED Discharge Orders          Ordered    cyclobenzaprine (FLEXERIL) 5 MG tablet  2 times daily PRN        10/22/20 0516             Drenda Freeze, MD 10/22/20 [phone]

## 2020-10-22 NOTE — Discharge Instructions (Addendum)
You likely have TMJ pain.   Please call oral surgeon, Dr. Benson Norway in the morning for appointment. Please mention that he came through the ED and was referred to her.  Return to ER if you have jaw dislocation, trouble swallowing, unable to close her mouth.

## 2020-11-03 ENCOUNTER — Ambulatory Visit (INDEPENDENT_AMBULATORY_CARE_PROVIDER_SITE_OTHER): Payer: 59 | Admitting: Cardiology

## 2020-11-03 ENCOUNTER — Encounter: Payer: Self-pay | Admitting: Cardiology

## 2020-11-03 ENCOUNTER — Other Ambulatory Visit: Payer: Self-pay

## 2020-11-03 VITALS — BP 112/64 | HR 65 | Ht 66.0 in | Wt 114.0 lb

## 2020-11-03 DIAGNOSIS — E782 Mixed hyperlipidemia: Secondary | ICD-10-CM

## 2020-11-03 DIAGNOSIS — I4510 Unspecified right bundle-branch block: Secondary | ICD-10-CM

## 2020-11-03 DIAGNOSIS — Z8249 Family history of ischemic heart disease and other diseases of the circulatory system: Secondary | ICD-10-CM | POA: Diagnosis not present

## 2020-11-03 DIAGNOSIS — Z720 Tobacco use: Secondary | ICD-10-CM | POA: Diagnosis not present

## 2020-11-03 DIAGNOSIS — I209 Angina pectoris, unspecified: Secondary | ICD-10-CM | POA: Diagnosis not present

## 2020-11-03 NOTE — Addendum Note (Signed)
Addended by: Senaida Ores on: 11/03/2020 02:51 PM   Modules accepted: Orders

## 2020-11-03 NOTE — Patient Instructions (Signed)
Medication Instructions:  Your physician recommends that you continue on your current medications as directed. Please refer to the Current Medication list given to you today.  *If you need a refill on your cardiac medications before your next appointment, please call your pharmacy*   Lab Work: Your physician recommends that you return for lab work today: lipid  If you have labs (blood work) drawn today and your tests are completely normal, you will receive your results only by: [place] (if you have MyChart) OR A paper copy in the mail If you have any lab test that is abnormal or we need to change your treatment, we will call you to review the results.   Testing/Procedures: None   Follow-Up: At [facility], you and your health needs are our priority.  As part of our continuing mission to provide you with exceptional heart care, we have created designated Provider Care Teams.  These Care Teams include your primary Cardiologist (physician) and Advanced Practice Providers (APPs -  Physician Assistants and Nurse Practitioners) who all work together to provide you with the care you need, when you need it.  We recommend signing up for the patient portal called "MyChart".  Sign up information is provided on this After Visit Summary.  MyChart is used to connect with patients for Virtual Visits (Telemedicine).  Patients are able to view lab/test results, encounter notes, upcoming appointments, etc.  Non-urgent messages can be sent to your provider as well.   To learn more about what you can do with MyChart, go to [email or web address].    Your next appointment:   1 year(s)  The format for your next appointment:   In Person  Provider:   Jenne Campus, MD   Other Instructions

## 2020-11-03 NOTE — Progress Notes (Signed)
Cardiology Office Note:    Date:  11/03/2020   ID:  Molly Ellis, DOB [birth year], MRN [id number]  PCP:  Angelina Sheriff, MD  Cardiologist:  Jenne Campus, MD    Referring MD: Angelina Sheriff, MD   Chief Complaint  Patient presents with   Follow-up  Doing well and I quit smoking  History of Present Illness:    Molly Ellis is a 50 y.o. female with medical history significant for atypical chest pain.  First episode of chest pain she had in 2015 she ended going to the hospital and was told to have small myocardial infarction then in 2022 she presented with some atypical chest pain cardiac catheterization has been performed which showed normal coronaries.  She still however complain of having some pain in the chest concerning for angina pectoris.  She may be having small vessel disease.  However a couple weeks ago she quit smoking and she says she is feeling much better.  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[place]) 02/25/2020   Tobacco abuse 12/10/2013    Past Surgical History:  Procedure Laterality Date   CARDIAC CATHETERIZATION     CHOLECYSTECTOMY     ENDOMETRIAL ABLATION     LAPAROSCOPIC OVARIAN CYSTECTOMY     LEFT HEART CATH AND CORONARY ANGIOGRAPHY N/A 03/05/2020   Procedure: LEFT HEART CATH AND CORONARY ANGIOGRAPHY;  Surgeon: Leonie Man, MD;  Location: [place] [facility];  Service: Cardiovascular;  Laterality: N/A;   TUBAL LIGATION      Current Medications: Current Meds  Medication Sig   ALPRAZolam (XANAX) 0.25 MG tablet Take 0.25 mg  by mouth 2 (two) times daily as needed (Headache).   aspirin EC 81 MG tablet Take 81 mg by mouth daily. Swallow whole.   cetirizine (ZYRTEC) 10 MG tablet Take 10 mg by mouth daily.    cyclobenzaprine (FLEXERIL) 5 MG tablet Take 1 tablet (5 mg total) by mouth 2 (two) times daily as needed. (Patient taking differently: Take 5 mg by mouth 2 (two) times daily as needed for muscle spasms.)   fluticasone (FLONASE) 50 MCG/ACT nasal spray Place 1 spray into both nostrils daily as needed for allergies.   Multiple Vitamins-Minerals (MULTIVITAMIN WITH MINERALS) tablet Take 1 tablet by mouth 3 (three) times a week. olly   nitroGLYCERIN (NITROSTAT) 0.4 MG SL tablet Place 1 tablet (0.4 mg total) under the tongue every 5 (five) minutes as needed for chest pain. Max 3 doses   ondansetron (ZOFRAN) 4 MG tablet Take 4 mg by mouth every 8 (eight) hours as needed for nausea or vomiting.   OVER THE COUNTER MEDICATION Take 20 mg by mouth daily as needed (Pain). delta 8 gummies   rosuvastatin (CRESTOR) 5 MG tablet Take 5 mg by mouth daily.     Allergies:   Penicillins, Codeine, Cymbalta [duloxetine hcl], and Other   Social History   Socioeconomic History   Marital status: Single    Spouse name: Not on file   Number of children: Not on file   Years of  education: Not on file   Highest education level: Not on file  Occupational History   Not on file  Tobacco Use   Smoking status: Former    Packs/day: 0.00    Types: Cigarettes    Quit date: 03/03/2020    Years since quitting: 0.6   Smokeless tobacco: Never  Vaping Use   Vaping Use: Never used  Substance and Sexual Activity   Alcohol use: Yes    Alcohol/week: 1.0 standard drink    Types: 1 Cans of beer per week    Comment: occ   Drug use: Yes    Types: Marijuana   Sexual activity: Yes    Birth control/protection: Surgical  Other Topics Concern   Not on file  Social History Narrative   Not on file   Social Determinants of Health   Financial  Resource Strain: Not on file  Food Insecurity: Not on file  Transportation Needs: Not on file  Physical Activity: Not on file  Stress: Not on file  Social Connections: Not on file     Family History: The patient's family history includes Asthma in her mother; Heart attack in her maternal grandfather; Heart failure in her maternal grandfather; Hypertension in her father and mother; Prostate cancer in her maternal uncle; Stroke in her mother. There is no history of Breast cancer, Ovarian cancer, or Colon cancer. ROS:   Please see the history of present illness.    All 14 point review of systems negative except as described per history of present illness  EKGs/Labs/Other Studies Reviewed:      Recent Labs: 02/25/2020: BUN 12; Creatinine, Ser 0.79; Hemoglobin 13.9; Platelets 274; Potassium 4.2; Sodium 142  Recent Lipid Panel No results found for: CHOL, TRIG, HDL, CHOLHDL, VLDL, LDLCALC, LDLDIRECT  Physical Exam:    VS:  BP 112/64 (BP Location: Right Arm, Patient Position: Sitting)   Pulse 65   Ht 5\' 6"  (1.676 m)   Wt 114 lb (51.7 kg)   SpO2 98%   BMI 18.40 kg/m     Wt Readings from Last 3 Encounters:  11/03/20 114 lb (51.7 kg)  03/10/20 105 lb 3.2 oz (47.7 kg)  03/05/20 108 lb (49 kg)     GEN:  Well nourished, well developed in no acute distress HEENT: Normal NECK: No JVD; No carotid bruits LYMPHATICS: No lymphadenopathy CARDIAC: RRR, no murmurs, no rubs, no gallops RESPIRATORY:  Clear to auscultation without rales, wheezing or rhonchi  ABDOMEN: Soft, non-tender, non-distended MUSCULOSKELETAL:  No edema; No deformity  SKIN: Warm and dry LOWER EXTREMITIES: no swelling NEUROLOGIC:  Alert and oriented x 3 PSYCHIATRIC:  Normal affect   ASSESSMENT:    1. Angina, class III ([place])   2. RBBB   3. Tobacco abuse   4. Family history of premature coronary artery disease    PLAN:    In order of problems listed above:  Chest pain, I suspect small vessel disease.  She seems  to be asymptomatic right now after she quit smoking.  She is on antiplatelet therapy as well as statin which I will continue. Dyslipidemia I do have her fasting lipid profile from January of this year with HDL of 66, I do not have LDL.  We will do direct LDL today. History of tobacco abuse she told me that she quit and she does not want to come back to  smoking I strongly encouraged her to stay away from smoking I told her that quitting smoking reduce chances to coronary artery disease  a 50%   Medication Adjustments/Labs and Tests Ordered: Current medicines are reviewed at length with the patient today.  Concerns regarding medicines are outlined above.  No orders of the defined types were placed in this encounter.  Medication changes: No orders of the defined types were placed in this encounter.   Signed, Park Liter, MD, [facility] 11/03/2020 2:44 PM    [place]

## 2020-11-04 LAB — LIPID PANEL
Chol/HDL Ratio: 3.2 ratio (ref 0.0–4.4)
Cholesterol, Total: 235 mg/dL — ABNORMAL HIGH (ref 100–199)
HDL: 74 mg/dL (ref >39)
LDL Chol Calc (NIH): 147 mg/dL — ABNORMAL HIGH (ref 0–99)
Triglycerides: 81 mg/dL (ref 0–149)
VLDL Cholesterol Cal: 14 mg/dL (ref 5–40)

## 2020-11-06 MED ORDER — ROSUVASTATIN CALCIUM 10 MG PO TABS: 10.0000 mg | ORAL_TABLET | Freq: Every day | ORAL | 3 refills | Status: AC

## 2020-11-06 NOTE — Addendum Note (Signed)
Addended by: Truddie Hidden on: 11/06/2020 02:11 PM   Modules accepted: Orders

## 2021-05-13 DIAGNOSIS — N760 Acute vaginitis: Secondary | ICD-10-CM | POA: Diagnosis not present

## 2021-05-13 DIAGNOSIS — J302 Other seasonal allergic rhinitis: Secondary | ICD-10-CM | POA: Diagnosis not present

## 2021-11-30 DIAGNOSIS — M797 Fibromyalgia: Secondary | ICD-10-CM | POA: Diagnosis not present

## 2021-11-30 DIAGNOSIS — Z87891 Personal history of nicotine dependence: Secondary | ICD-10-CM | POA: Diagnosis not present

## 2021-11-30 DIAGNOSIS — Z681 Body mass index (BMI) 19 or less, adult: Secondary | ICD-10-CM | POA: Diagnosis not present

## 2021-11-30 DIAGNOSIS — L89899 Pressure ulcer of other site, unspecified stage: Secondary | ICD-10-CM | POA: Diagnosis not present

## 2021-12-15 DIAGNOSIS — Z681 Body mass index (BMI) 19 or less, adult: Secondary | ICD-10-CM | POA: Diagnosis not present

## 2021-12-15 DIAGNOSIS — M797 Fibromyalgia: Secondary | ICD-10-CM | POA: Diagnosis not present

## 2021-12-15 DIAGNOSIS — J302 Other seasonal allergic rhinitis: Secondary | ICD-10-CM | POA: Diagnosis not present

## 2021-12-18 DIAGNOSIS — Z419 Encounter for procedure for purposes other than remedying health state, unspecified: Secondary | ICD-10-CM | POA: Diagnosis not present

## 2022-01-18 DIAGNOSIS — Z419 Encounter for procedure for purposes other than remedying health state, unspecified: Secondary | ICD-10-CM | POA: Diagnosis not present

## 2022-01-26 ENCOUNTER — Ambulatory Visit: Payer: 59 | Admitting: Cardiology

## 2022-01-29 DIAGNOSIS — N390 Urinary tract infection, site not specified: Secondary | ICD-10-CM | POA: Diagnosis not present

## 2022-02-15 DIAGNOSIS — Z1211 Encounter for screening for malignant neoplasm of colon: Secondary | ICD-10-CM | POA: Diagnosis not present

## 2022-02-15 DIAGNOSIS — Z1212 Encounter for screening for malignant neoplasm of rectum: Secondary | ICD-10-CM | POA: Diagnosis not present

## 2022-02-18 DIAGNOSIS — Z419 Encounter for procedure for purposes other than remedying health state, unspecified: Secondary | ICD-10-CM | POA: Diagnosis not present

## 2022-03-19 DIAGNOSIS — Z419 Encounter for procedure for purposes other than remedying health state, unspecified: Secondary | ICD-10-CM | POA: Diagnosis not present

## 2022-04-27 DIAGNOSIS — K5900 Constipation, unspecified: Secondary | ICD-10-CM | POA: Diagnosis not present

## 2022-04-27 DIAGNOSIS — R195 Other fecal abnormalities: Secondary | ICD-10-CM | POA: Diagnosis not present

## 2022-05-05 ENCOUNTER — Ambulatory Visit: Payer: 59 | Attending: Cardiology | Admitting: Cardiology

## 2022-05-05 ENCOUNTER — Encounter: Payer: Self-pay | Admitting: Cardiology

## 2022-05-05 VITALS — BP 88/64 | HR 60 | Ht 66.0 in | Wt 109.0 lb

## 2022-05-05 DIAGNOSIS — Z720 Tobacco use: Secondary | ICD-10-CM

## 2022-05-05 DIAGNOSIS — E7800 Pure hypercholesterolemia, unspecified: Secondary | ICD-10-CM

## 2022-05-05 DIAGNOSIS — R079 Chest pain, unspecified: Secondary | ICD-10-CM | POA: Diagnosis not present

## 2022-05-05 DIAGNOSIS — I4510 Unspecified right bundle-branch block: Secondary | ICD-10-CM | POA: Diagnosis not present

## 2022-05-05 NOTE — Patient Instructions (Addendum)
Medication Instructions:  Your physician recommends that you continue on your current medications as directed. Please refer to the Current Medication list given to you today.  *If you need a refill on your cardiac medications before your next appointment, please call your pharmacy*   Lab Work: Your physician recommends that you return for lab work in: when fasting You need to have labs done when you are fasting.  You can come Monday through Friday 8:30 am to 12:00 pm and 1:15 to 4:30. You do not need to make an appointment as the order has already been placed. The labs you are going to have done are Lipids. .   Testing/Procedures: None Ordered   Follow-Up: At [facility], you and your health needs are our priority.  As part of our continuing mission to provide you with exceptional heart care, we have created designated Provider Care Teams.  These Care Teams include your primary Cardiologist (physician) and Advanced Practice Providers (APPs -  Physician Assistants and Nurse Practitioners) who all work together to provide you with the care you need, when you need it.  We recommend signing up for the patient portal called "MyChart".  Sign up information is provided on this After Visit Summary.  MyChart is used to connect with patients for Virtual Visits (Telemedicine).  Patients are able to view lab/test results, encounter notes, upcoming appointments, etc.  Non-urgent messages can be sent to your provider as well.   To learn more about what you can do with MyChart, go to [email or web address].    Your next appointment:   12 month(s)  The format for your next appointment:   In Person  Provider:   Robert Krasowski, MD    Other Instructions NA  

## 2022-05-05 NOTE — Progress Notes (Signed)
Cardiology Office Note:    Date:  05/05/2022   ID:  Molly Ellis, DOB 12-08-1970, MRN [id number]  PCP:  Noni Saupe, MD  Cardiologist:  Gypsy Balsam, MD    Referring MD: Noni Saupe, MD   Chief Complaint  Patient presents with   Follow-up    Issues when standing up  Doing well  History of Present Illness:    Molly Ellis is a 52 y.o. 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  She has been put on appropriate guideline directed medical therapy and since that time she seems to be doing well be continuous is the fact that she quit smoking about year ago and we congratulated her for it.  She took Crestor for few months and then simply ran out of it did not bother to call us to get it refilled.  Overall doing very well.  She started exercises on the regular basis she went to build up some muscle mass.  Denies having any typical chest pain tightness squeezing pressure burning chest.  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 12/12/2017   Chest pain of uncertain etiology 12/10/2013   Family history of Graves' disease 11/04/2017   Heart attack 01/2014   History of removal of ovarian cyst 11/04/2017   Hypercholesterolemia 12/10/2013   Kidney stones    Kidney stones    Migraine 12/10/2013   Mixed hyperlipidemia 02/25/2020   RBBB 12/10/2013   Stable angina pectoris 02/25/2020   Tobacco abuse 12/10/2013    Past Surgical History:  Procedure Laterality Date   CARDIAC CATHETERIZATION     CHOLECYSTECTOMY     ENDOMETRIAL ABLATION     LAPAROSCOPIC OVARIAN CYSTECTOMY     LEFT HEART CATH AND CORONARY ANGIOGRAPHY N/A 03/05/2020   Procedure: LEFT HEART CATH AND CORONARY ANGIOGRAPHY;   Surgeon: Marykay Lex, MD;  Location: [facility] INVASIVE CV LAB;  Service: Cardiovascular;  Laterality: N/A;   TUBAL LIGATION      Current Medications: Current Meds  Medication Sig   ALPRAZolam (XANAX) 0.25 MG tablet Take 0.25 mg by mouth 2 (two) times daily as needed (Headache).   aspirin EC 81 MG tablet Take 81 mg by mouth daily. Swallow whole.   cetirizine (ZYRTEC) 10 MG tablet Take 10 mg by mouth daily.    cyclobenzaprine (FLEXERIL) 5 MG tablet Take 1 tablet (5 mg total) by mouth 2 (two) times daily as needed. (Patient taking differently: Take 5 mg by mouth 2 (two) times daily as needed for muscle spasms.)   fluticasone (FLONASE) 50 MCG/ACT nasal spray Place 1 spray into both nostrils daily as needed for allergies.   Multiple Vitamins-Minerals (MULTIVITAMIN WITH MINERALS) tablet Take 1 tablet by mouth 3 (three) times a week. olly   nitroGLYCERIN (NITROSTAT) 0.4 MG SL tablet Place 1 tablet (0.4 mg total) under the tongue every 5 (five) minutes as needed for chest pain. Max 3 doses   OVER THE COUNTER MEDICATION Take 20 mg by mouth daily as needed (Pain). delta 8 gummies   rosuvastatin (CRESTOR) 10 MG tablet Take 1 tablet (10 mg total) by mouth daily.     Allergies:   Penicillins, Codeine, Cymbalta [duloxetine hcl], and Other   Social History   Socioeconomic History  Marital status: Single    Spouse name: Not on file   Number of children: Not on file   Years of education: Not on file   Highest education level: Not on file  Occupational History   Not on file  Tobacco Use   Smoking status: Former    Packs/day: 0    Types: Cigarettes    Quit date: 03/03/2020    Years since quitting: 2.1   Smokeless tobacco: Never  Vaping Use   Vaping Use: Never used  Substance and Sexual Activity   Alcohol use: Yes    Alcohol/week: 1.0 standard drink of alcohol    Types: 1 Cans of beer per week    Comment: occ   Drug use: Yes    Types: Marijuana   Sexual activity: Yes    Birth  control/protection: Surgical  Other Topics Concern   Not on file  Social History Narrative   Not on file   Social Determinants of Health   Financial Resource Strain: Not on file  Food Insecurity: Not on file  Transportation Needs: Not on file  Physical Activity: Not on file  Stress: Not on file  Social Connections: Not on file     Family History: The patient's family history includes Asthma in her mother; Heart attack in her maternal grandfather; Heart failure in her maternal grandfather; Hypertension in her father and mother; Prostate cancer in her maternal uncle; Stroke in her mother. There is no history of Breast cancer, Ovarian cancer, or Colon cancer. ROS:   Please see the history of present illness.    All 14 point review of systems negative except as described per history of present illness  EKGs/Labs/Other Studies Reviewed:      Recent Labs: No results found for requested labs within last 365 days.  Recent Lipid Panel    Component Value Date/Time   CHOL 235 (H) 11/04/2020 0829   TRIG 81 11/04/2020 0829   HDL 74 11/04/2020 0829   CHOLHDL 3.2 11/04/2020 0829   LDLCALC 147 (H) 11/04/2020 0829    Physical Exam:    VS:  BP (!) 88/64 (BP Location: Left Arm, Patient Position: Sitting)   Pulse 60   Ht 5\' 6"  (1.676 m)   Wt 109 lb (49.4 kg)   SpO2 94%   BMI 17.59 kg/m     Wt Readings from Last 3 Encounters:  05/05/22 109 lb (49.4 kg)  11/03/20 114 lb (51.7 kg)  03/10/20 105 lb 3.2 oz (47.7 kg)     GEN:  Well nourished, well developed in no acute distress HEENT: Normal NECK: No JVD; No carotid bruits LYMPHATICS: No lymphadenopathy CARDIAC: RRR, no murmurs, no rubs, no gallops RESPIRATORY:  Clear to auscultation without rales, wheezing or rhonchi  ABDOMEN: Soft, non-tender, non-distended MUSCULOSKELETAL:  No edema; No deformity  SKIN: Warm and dry LOWER EXTREMITIES: no swelling NEUROLOGIC:  Alert and oriented x 3 PSYCHIATRIC:  Normal affect   ASSESSMENT:     1. RBBB   2. Chest pain of uncertain etiology   3. Hypercholesterolemia   4. Tobacco abuse    PLAN:    In order of problems listed above:  Right bundle branch block which is chronic.  Continue monitoring. Chest pain denies having atypical symptoms.  Continue risk factors modifications. Dyslipidemia I do have data from 2023 with total cholesterol 212 HDL 65 will get another fasting lipid profile tomorrow. Tobacco abuse she quit smoking which congratulated her for this.   Medication Adjustments/Labs and Tests Ordered:  Current medicines are reviewed at length with the patient today.  Concerns regarding medicines are outlined above.  No orders of the defined types were placed in this encounter.  Medication changes: No orders of the defined types were placed in this encounter.   Signed, Georgeanna Lea, MD, [facility] 05/05/2022 4:09 PM    [facility]

## 2022-05-06 DIAGNOSIS — E7800 Pure hypercholesterolemia, unspecified: Secondary | ICD-10-CM | POA: Diagnosis not present

## 2022-05-08 LAB — LIPID PANEL
Chol/HDL Ratio: 2.9 ratio (ref 0.0–4.4)
Cholesterol, Total: 212 mg/dL — ABNORMAL HIGH (ref 100–199)
HDL: 73 mg/dL (ref >39)
LDL Chol Calc (NIH): 124 mg/dL — ABNORMAL HIGH (ref 0–99)
Triglycerides: 84 mg/dL (ref 0–149)
VLDL Cholesterol Cal: 15 mg/dL (ref 5–40)

## 2022-05-13 ENCOUNTER — Telehealth: Payer: Self-pay

## 2022-05-13 DIAGNOSIS — E782 Mixed hyperlipidemia: Secondary | ICD-10-CM

## 2022-05-13 NOTE — Telephone Encounter (Signed)
Spoke with pt about lab results. She is not taking any cholesterol meds at this time and agreed to start and then have blood work in 6 weeks. Will send Rx to [facility]. Routed to PCP.

## 2022-05-13 NOTE — Telephone Encounter (Signed)
Spoke with pt about Cholesterol results. She stated that she is not taking any Cholesterol medications. She agreed to start medications and have blood work checked in 6 weeks. Will send Rx to [facility].

## 2022-05-18 DIAGNOSIS — Z1211 Encounter for screening for malignant neoplasm of colon: Secondary | ICD-10-CM | POA: Diagnosis not present

## 2022-05-18 DIAGNOSIS — R195 Other fecal abnormalities: Secondary | ICD-10-CM | POA: Diagnosis not present

## 2022-05-18 LAB — HM COLONOSCOPY

## 2022-05-19 DIAGNOSIS — Z419 Encounter for procedure for purposes other than remedying health state, unspecified: Secondary | ICD-10-CM | POA: Diagnosis not present

## 2022-06-19 DIAGNOSIS — Z419 Encounter for procedure for purposes other than remedying health state, unspecified: Secondary | ICD-10-CM | POA: Diagnosis not present

## 2022-07-16 ENCOUNTER — Encounter (HOSPITAL_COMMUNITY): Payer: Self-pay

## 2022-07-16 ENCOUNTER — Ambulatory Visit (HOSPITAL_COMMUNITY)
Admission: EM | Admit: 2022-07-16 | Discharge: 2022-07-16 | Disposition: A | Discharge status: Home / Self Care | Payer: 59 | Attending: Emergency Medicine | Admitting: Emergency Medicine

## 2022-07-16 DIAGNOSIS — R300 Dysuria: Secondary | ICD-10-CM | POA: Insufficient documentation

## 2022-07-16 DIAGNOSIS — N3001 Acute cystitis with hematuria: Secondary | ICD-10-CM | POA: Insufficient documentation

## 2022-07-16 LAB — POCT URINALYSIS DIP (MANUAL ENTRY)
Bilirubin, UA: NEGATIVE
Glucose, UA: 100 mg/dL — AB
Ketones, POC UA: NEGATIVE mg/dL
Nitrite, UA: POSITIVE — AB
Spec Grav, UA: <=1.005 — AB (ref 1.010–1.025)
Urobilinogen, UA: 1 E.U./dL
pH, UA: 6 (ref 5.0–8.0)

## 2022-07-16 MED ORDER — SULFAMETHOXAZOLE-TRIMETHOPRIM 800-160 MG PO TABS: 1.0000 | ORAL_TABLET | Freq: Two times a day (BID) | ORAL | 0 refills | Status: DC

## 2022-07-16 NOTE — Discharge Instructions (Signed)
Start sulfamethoxazole-trimethoprim twice daily for 5 days.  If you develop any rash or lesions stop the medication and be seen immediately.  Make sure you are drinking plenty of fluid.  You can use Tylenol, ibuprofen, Azo for symptom relief.  We will contact you if any to arrange additional treatment based on your culture result.  If your symptoms do not improving within a few days please be reevaluated.  If anything worsens and you have increasing abdominal pain, fever, nausea, vomiting, flank pain, pelvic pain you need to go to the emergency room.

## 2022-07-16 NOTE — ED Triage Notes (Signed)
Patient reports that she has had dysuria early this AM and frequency. Patient reports that she has a history of UTI and kidney stones.

## 2022-07-16 NOTE — ED Provider Notes (Signed)
[facility]    CSN: [id number] Arrival date & time: 07/16/22  1819      History   Chief Complaint No chief complaint on file.   HPI Molly Ellis is a 52 y.o. female.   Patient presents today with a 1 day history of worsening UTI symptoms.  When she first woke up this morning she had some discomfort with urination.  Later this afternoon she developed severe pain with urination to the point that it caused her to vomit.  She denies any fever, ongoing nausea/vomiting, hematuria, flank pain, pelvic pain, vaginal discharge.  She does have a history of kidney stones but states current symptoms are not similar to previous episodes of this condition.  She denies any recent urogenital procedure, self-catheterization, single kidney.  She denies history of diabetes and does not take SGLT2 inhibitor.  Denies any recent antibiotics in the past 90 days.  She has tried Azo with temporary improvement of symptoms.  She has no concern for pregnancy.    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 12/12/2017   Chest pain of uncertain etiology 12/10/2013   Family history of Graves' disease 11/04/2017   Heart attack (HCC) 01/2014   History of removal of ovarian cyst 11/04/2017   Hypercholesterolemia 12/10/2013   Kidney stones    Kidney stones    Migraine 12/10/2013   Mixed hyperlipidemia 02/25/2020   RBBB 12/10/2013   Stable angina pectoris 02/25/2020   Tobacco abuse 12/10/2013    Patient Active Problem List   Diagnosis Date Noted   Family history of premature coronary artery disease 03/05/2020   Angina, class III (HCC) 02/25/2020   Mixed hyperlipidemia 02/25/2020   Kidney stones    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 12/12/2017   Family history of Graves' disease 11/04/2017   History of removal of ovarian cyst 11/04/2017   Heart attack (HCC) 01/2014   Chest pain of uncertain etiology  12/10/2013   RBBB 12/10/2013   Hypercholesterolemia 12/10/2013   Migraine 12/10/2013   Tobacco abuse 12/10/2013    Past Surgical History:  Procedure Laterality Date   CARDIAC CATHETERIZATION     CHOLECYSTECTOMY     ENDOMETRIAL ABLATION     LAPAROSCOPIC OVARIAN CYSTECTOMY     LEFT HEART CATH AND CORONARY ANGIOGRAPHY N/A 03/05/2020   Procedure: LEFT HEART CATH AND CORONARY ANGIOGRAPHY;  Surgeon: Marykay Lex, MD;  Location: [facility] INVASIVE CV LAB;  Service: Cardiovascular;  Laterality: N/A;   TUBAL LIGATION      OB History     Gravida  10   Para  3   Term  0   Preterm  3   AB  7   Living  2      SAB  7   IAB      Ectopic      Multiple      Live Births  2            Home Medications    Prior to Admission medications   Medication Sig Start Date End Date Taking? Authorizing Provider  sulfamethoxazole-trimethoprim (BACTRIM DS) 800-160 MG tablet Take 1 tablet by mouth 2 (two) times daily for 5 days. 07/16/22 07/21/22 Yes Adelie Croswell, Noberto Retort, PA-C  ALPRAZolam (XANAX) 0.25 MG tablet Take 0.25 mg by mouth 2 (two) times daily as needed (Headache).    [provider]  cetirizine (ZYRTEC) 10 MG tablet Take 10 mg by  mouth daily.     [provider]  cyclobenzaprine (FLEXERIL) 5 MG tablet Take 1 tablet (5 mg total) by mouth 2 (two) times daily as needed. Patient taking differently: Take 5 mg by mouth 2 (two) times daily as needed for muscle spasms. 10/22/20   Charlynne Pander, MD  fluticasone [facility]) 50 MCG/ACT nasal spray Place 1 spray into both nostrils daily as needed for allergies.    [provider]  Multiple Vitamins-Minerals (MULTIVITAMIN WITH MINERALS) tablet Take 1 tablet by mouth 3 (three) times a week. olly    [provider]  nitroGLYCERIN (NITROSTAT) 0.4 MG SL tablet Place 1 tablet (0.4 mg total) under the tongue every 5 (five) minutes as needed for chest pain. Max 3 doses 02/25/20   Tobb, Kardie, DO  ondansetron (ZOFRAN) 4 MG  tablet Take 4 mg by mouth every 8 (eight) hours as needed for nausea or vomiting.    [provider]  OVER THE COUNTER MEDICATION Take 20 mg by mouth daily as needed (Pain). delta 8 gummies    [provider]  rosuvastatin (CRESTOR) 10 MG tablet Take 1 tablet (10 mg total) by mouth daily. 11/06/20   Georgeanna Lea, MD    Family History Family History  Problem Relation Age of Onset   Asthma Mother    Hypertension Mother    Stroke Mother    Hypertension Father    Heart failure Maternal Grandfather    Heart attack Maternal Grandfather    Prostate cancer Maternal Uncle    Breast cancer Neg Hx    Ovarian cancer Neg Hx    Colon cancer Neg Hx     Social History Social History   Tobacco Use   Smoking status: Former    Packs/day: 0    Types: Cigarettes    Quit date: 03/03/2020    Years since quitting: 2.3   Smokeless tobacco: Never  Vaping Use   Vaping Use: Never used  Substance Use Topics   Alcohol use: Yes    Alcohol/week: 1.0 standard drink of alcohol    Types: 1 Cans of beer per week    Comment: occ   Drug use: Yes    Types: Marijuana     Allergies   Penicillins, Codeine, Cymbalta [duloxetine hcl], and Other   Review of Systems Review of Systems  Constitutional:  Positive for activity change. Negative for appetite change, fatigue and fever.  Gastrointestinal:  Positive for vomiting (1 episode following severe with urination). Negative for abdominal pain, diarrhea and nausea.  Genitourinary:  Positive for dysuria, frequency and urgency. Negative for flank pain, hematuria, pelvic pain, vaginal bleeding, vaginal discharge and vaginal pain.  Musculoskeletal:  Negative for arthralgias, back pain and myalgias.     Physical Exam Triage Vital Signs ED Triage Vitals  Enc Vitals Group     BP 07/16/22 1925 (!) 132/94     Pulse Rate 07/16/22 1925 (!) 58     Resp 07/16/22 1925 14     Temp 07/16/22 1925 98 F (36.7 C)     Temp Source 07/16/22 1925  Oral     SpO2 07/16/22 1925 97 %     Weight --      Height --      Head Circumference --      Peak Flow --      Pain Score 07/16/22 1928 2     Pain Loc --      Pain Edu? --      Excl.  in GC? --    No data found.  Updated Vital Signs BP (!) 132/94 (BP Location: Right Arm)   Pulse (!) 58   Temp 98 F (36.7 C) (Oral)   Resp 14   SpO2 97%   Visual Acuity Right Eye Distance:   Left Eye Distance:   Bilateral Distance:    Right Eye Near:   Left Eye Near:    Bilateral Near:     Physical Exam Vitals reviewed.  Constitutional:      General: She is awake. She is not in acute distress.    Appearance: Normal appearance. She is well-developed. She is not ill-appearing.     Comments: Very pleasant female appears stated age in no acute distress sitting comfortably in exam room  HENT:     Head: Normocephalic and atraumatic.  Cardiovascular:     Rate and Rhythm: Normal rate and regular rhythm.     Heart sounds: Normal heart sounds, S1 normal and S2 normal. No murmur heard. Pulmonary:     Effort: Pulmonary effort is normal.     Breath sounds: Normal breath sounds. No wheezing, rhonchi or rales.     Comments: Clear to auscultation bilaterally Abdominal:     General: Bowel sounds are normal.     Palpations: Abdomen is soft.     Tenderness: There is abdominal tenderness in the suprapubic area. There is no right CVA tenderness, left CVA tenderness, guarding or rebound.     Comments: Mild tenderness palpation over suprapubic region.  No evidence of acute abdomen.  No CVA tenderness.  Psychiatric:        Behavior: Behavior is cooperative.      UC Treatments / Results  Labs (all labs ordered are listed, but only abnormal results are displayed) Labs Reviewed  POCT URINALYSIS DIP (MANUAL ENTRY) - Abnormal; Notable for the following components:      Result Value   Color, UA red (*)    Clarity, UA cloudy (*)    Glucose, UA =100 (*)    Spec Grav, UA <=1.005 (*)    Blood, UA  moderate (*)    Protein Ur, POC trace (*)    Nitrite, UA Positive (*)    Leukocytes, UA Large (3+) (*)    All other components within normal limits  URINE CULTURE    EKG   Radiology No results found.  Procedures Procedures (including critical care time)  Medications Ordered in UC Medications - No data to display  Initial Impression / Assessment and Plan / UC Course  I have reviewed the triage vital signs and the nursing notes.  Pertinent labs & imaging results that were available during my care of the patient were reviewed by me and considered in my medical decision making (see chart for details).     Patient is well-appearing, afebrile, nontoxic, nontachycardic.  Vital signs and physical exam are reassuring with no indication for emergent evaluation or imaging.  UA did show evidence of infection but patient also did take Azo.  Will empirically treat with Bactrim DS twice daily for 5 days.  Discussed that if she develops any rash or oral lesions she did stop the medication to be seen immediately.  She is to push fluids.  Can use over-the-counter medication for pain relief.  Will send her urine for culture and we discussed potential need to change antibiotics based on susceptibilities identified on culture.  If she has any worsening or changing symptoms including increasing pain, fever, nausea, vomiting, hematuria, pelvic  pain she needs to be seen emergently.  Strict return precautions given.  Patient declined work excuse note.  Final Clinical Impressions(s) / UC Diagnoses   Final diagnoses:  Acute cystitis with hematuria  Dysuria     Discharge Instructions      Start sulfamethoxazole-trimethoprim twice daily for 5 days.  If you develop any rash or lesions stop the medication and be seen immediately.  Make sure you are drinking plenty of fluid.  You can use Tylenol, ibuprofen, Azo for symptom relief.  We will contact you if any to arrange additional treatment based on your  culture result.  If your symptoms do not improving within a few days please be reevaluated.  If anything worsens and you have increasing abdominal pain, fever, nausea, vomiting, flank pain, pelvic pain you need to go to the emergency room.     ED Prescriptions     Medication Sig Dispense Auth. Provider   sulfamethoxazole-trimethoprim (BACTRIM DS) 800-160 MG tablet Take 1 tablet by mouth 2 (two) times daily for 5 days. 10 tablet Breyona Swander, Noberto Retort, PA-C      PDMP not reviewed this encounter.   Jeani Hawking, PA-C 07/16/22 1947

## 2022-07-18 LAB — URINE CULTURE: Culture: <10000 — AB

## 2022-07-19 DIAGNOSIS — Z419 Encounter for procedure for purposes other than remedying health state, unspecified: Secondary | ICD-10-CM | POA: Diagnosis not present

## 2022-07-21 ENCOUNTER — Ambulatory Visit (HOSPITAL_COMMUNITY)
Admission: EM | Admit: 2022-07-21 | Discharge: 2022-07-21 | Disposition: A | Discharge status: Home / Self Care | Payer: 59 | Attending: Family Medicine | Admitting: Family Medicine

## 2022-07-21 ENCOUNTER — Encounter (HOSPITAL_COMMUNITY): Payer: Self-pay | Admitting: Emergency Medicine

## 2022-07-21 DIAGNOSIS — R300 Dysuria: Secondary | ICD-10-CM | POA: Diagnosis not present

## 2022-07-21 DIAGNOSIS — Z113 Encounter for screening for infections with a predominantly sexual mode of transmission: Secondary | ICD-10-CM | POA: Diagnosis not present

## 2022-07-21 LAB — POCT URINALYSIS DIP (MANUAL ENTRY)
Bilirubin, UA: NEGATIVE
Glucose, UA: NEGATIVE mg/dL
Ketones, POC UA: NEGATIVE mg/dL
Nitrite, UA: NEGATIVE
Protein Ur, POC: NEGATIVE mg/dL
Spec Grav, UA: 1.015 (ref 1.010–1.025)
Urobilinogen, UA: 0.2 E.U./dL
pH, UA: 6.5 (ref 5.0–8.0)

## 2022-07-21 MED ORDER — PHENAZOPYRIDINE HCL 200 MG PO TABS: 200.0000 mg | ORAL_TABLET | Freq: Three times a day (TID) | ORAL | 0 refills | Status: AC

## 2022-07-21 MED ORDER — CEPHALEXIN 500 MG PO CAPS: 500.0000 mg | ORAL_CAPSULE | Freq: Two times a day (BID) | ORAL | 0 refills | Status: AC

## 2022-07-21 NOTE — ED Provider Notes (Signed)
[facility]    ASSESSMENT & PLAN:  1. Dysuria   2. Screening for STDs (sexually transmitted diseases)    Still with mod leuks on U/A and still with dysuria. Trial of Keflex and await repeat urine culture. Vaginal cytology pending also.  Meds ordered this encounter  Medications   cephALEXin (KEFLEX) 500 MG capsule    Sig: Take 1 capsule (500 mg total) by mouth 2 (two) times daily.    Dispense:  10 capsule    Refill:  0   phenazopyridine (PYRIDIUM) 200 MG tablet    Sig: Take 1 tablet (200 mg total) by mouth 3 (three) times daily.    Dispense:  6 tablet    Refill:  0     Discharge Instructions      In addition to a urine culture, we have sent testing for various causes of vaginal irritation and infection. We will notify you of any positive results once they are received. If required, we will prescribe any medications you might need.  Please refrain from all sexual activity for at least the next seven days.  No signs of pyelonephritis. Urine culture sent. Will notify patient of any significant results.   Follow-up Information     Noni Saupe, MD.   Specialty: Family Medicine Why: If worsening or failing to improve as anticipated. Contact information: [address] [place] [address] [phone]                  Outlined signs and symptoms indicating need for more acute intervention. Patient verbalized understanding. After Visit Summary given.  SUBJECTIVE:  Molly Ellis is a 52 y.o. female who was seen here on 07/16/2002; note reviewed. Complains of persistent mild. Wonders is she passes a kidney stone since last visit. Denies gross hematuria. Normal PO intake without n/v/d. Without specific abdominal or back pain. Ambulatory without difficulty. Finished 5 days Bactrim.  LMP: No LMP recorded. Patient has had an ablation.  OBJECTIVE:  Vitals:   07/21/22 1126  BP: 113/75  Pulse: (!) 57  Resp: 14  Temp: 98.2 F (36.8  C)  TempSrc: Oral  SpO2: 97%   General appearance: alert; no distress HENT: oropharynx: moist Lungs: unlabored respirations Abdomen: soft Back: no CVA tenderness Extremities: no edema; symmetrical with no gross deformities Skin: warm and dry Neurologic: normal gait Psychological: alert and cooperative; normal mood and affect  Labs Reviewed  POCT URINALYSIS DIP (MANUAL ENTRY) - Abnormal; Notable for the following components:      Result Value   Blood, UA trace-lysed (*)    Leukocytes, UA Moderate (2+) (*)    All other components within normal limits  URINE CULTURE  CERVICOVAGINAL ANCILLARY ONLY    Allergies  Allergen Reactions   Penicillins Other (See Comments) and Anaphylaxis    Has patient had a PCN reaction causing immediate rash, facial/tongue/throat swelling, SOB or lightheadedness with hypotension: Yes Has patient had a PCN reaction causing severe rash involving mucus membranes or skin necrosis: No Has patient had a PCN reaction that required hospitalization: Urgent care visit Has patient had a PCN reaction occurring within the last 10 years: No If all of the above answers are "NO", then may proceed with Cephalosporin use.    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 12/12/2017   Chest pain of uncertain etiology 12/10/2013   Family history of Graves' disease 11/04/2017   Heart attack (HCC) 01/2014   History of removal of ovarian cyst 11/04/2017   Hypercholesterolemia 12/10/2013   Kidney stones    Kidney stones    Migraine 12/10/2013   Mixed hyperlipidemia 02/25/2020   RBBB 12/10/2013   Stable angina pectoris 02/25/2020   Tobacco abuse 12/10/2013   Social History    Socioeconomic History   Marital status: Single    Spouse name: Not on file   Number of children: Not on file   Years of education: Not on file   Highest education level: Not on file  Occupational History   Not on file  Tobacco Use   Smoking status: Former    Packs/day: 0    Types: Cigarettes    Quit date: 03/03/2020    Years since quitting: 2.3   Smokeless tobacco: Never  Vaping Use   Vaping Use: Never used  Substance and Sexual Activity   Alcohol use: Yes    Alcohol/week: 1.0 standard drink of alcohol    Types: 1 Cans of beer per week    Comment: occ   Drug use: Yes    Types: Marijuana   Sexual activity: Yes    Birth control/protection: Surgical  Other Topics Concern   Not on file  Social History Narrative   Not on file   Social Determinants of Health   Financial Resource Strain: Not on file  Food Insecurity: Not on file  Transportation Needs: Not on file  Physical Activity: Not on file  Stress: Not on file  Social Connections: Not on file  Intimate Partner Violence: Not on file   Family History  Problem Relation Age of Onset   Asthma Mother    Hypertension Mother    Stroke Mother    Hypertension Father    Heart failure Maternal Grandfather    Heart attack Maternal Grandfather    Prostate cancer Maternal Uncle    Breast cancer Neg Hx    Ovarian cancer Neg Hx    Colon cancer Neg Hx         Mardella Layman, MD 07/21/22 1219

## 2022-07-21 NOTE — Discharge Instructions (Signed)
In addition to a urine culture, we have sent testing for various causes of vaginal irritation and infection. We will notify you of any positive results once they are received. If required, we will prescribe any medications you might need.  Please refrain from all sexual activity for at least the next seven days.

## 2022-07-21 NOTE — ED Triage Notes (Signed)
Pt reports still having burning with and without urination. Reports was seen here on Friday and was treated with antibiotics for UTI. Culture on urine came back good. Pt concerned since symptoms aren't better.

## 2022-07-22 LAB — URINE CULTURE: Culture: >=100000 — AB

## 2022-07-23 LAB — CERVICOVAGINAL ANCILLARY ONLY
Bacterial Vaginitis (gardnerella): POSITIVE — AB
Candida Glabrata: NEGATIVE
Candida Vaginitis: POSITIVE — AB
Chlamydia: NEGATIVE
Comment: NEGATIVE
Comment: NEGATIVE
Comment: NEGATIVE
Comment: NEGATIVE
Comment: NEGATIVE
Comment: NORMAL
Neisseria Gonorrhea: NEGATIVE
Trichomonas: NEGATIVE

## 2022-07-23 LAB — URINE CULTURE

## 2022-07-24 ENCOUNTER — Telehealth: Payer: Self-pay

## 2022-07-24 MED ORDER — METRONIDAZOLE 0.75 % VA GEL: 1.0000 | Freq: Every day | VAGINAL | Status: AC

## 2022-07-24 MED ORDER — FLUCONAZOLE 150 MG PO TABS: 150.0000 mg | ORAL_TABLET | Freq: Once | ORAL | 0 refills | Status: AC

## 2022-07-24 NOTE — Telephone Encounter (Signed)
Per protocol pt requires tx with Flagyl and Diflucan.  Reviewed with patient, verified pharmacy, prescription sent.

## 2022-08-19 DIAGNOSIS — Z419 Encounter for procedure for purposes other than remedying health state, unspecified: Secondary | ICD-10-CM | POA: Diagnosis not present

## 2022-09-19 DIAGNOSIS — Z419 Encounter for procedure for purposes other than remedying health state, unspecified: Secondary | ICD-10-CM | POA: Diagnosis not present

## 2022-10-19 DIAGNOSIS — Z419 Encounter for procedure for purposes other than remedying health state, unspecified: Secondary | ICD-10-CM | POA: Diagnosis not present

## 2022-11-19 DIAGNOSIS — Z419 Encounter for procedure for purposes other than remedying health state, unspecified: Secondary | ICD-10-CM | POA: Diagnosis not present

## 2022-12-19 DIAGNOSIS — Z419 Encounter for procedure for purposes other than remedying health state, unspecified: Secondary | ICD-10-CM | POA: Diagnosis not present

## 2023-01-19 DIAGNOSIS — Z419 Encounter for procedure for purposes other than remedying health state, unspecified: Secondary | ICD-10-CM | POA: Diagnosis not present

## 2023-02-19 DIAGNOSIS — Z419 Encounter for procedure for purposes other than remedying health state, unspecified: Secondary | ICD-10-CM | POA: Diagnosis not present

## 2023-03-19 DIAGNOSIS — Z419 Encounter for procedure for purposes other than remedying health state, unspecified: Secondary | ICD-10-CM | POA: Diagnosis not present

## 2023-04-10 ENCOUNTER — Emergency Department (HOSPITAL_COMMUNITY)

## 2023-04-10 ENCOUNTER — Other Ambulatory Visit: Payer: Self-pay

## 2023-04-10 ENCOUNTER — Encounter (HOSPITAL_COMMUNITY): Payer: Self-pay | Admitting: Emergency Medicine

## 2023-04-10 ENCOUNTER — Emergency Department (HOSPITAL_COMMUNITY)
Admission: EM | Admit: 2023-04-10 | Discharge: 2023-04-10 | Disposition: A | Discharge status: Home / Self Care | Attending: Emergency Medicine | Admitting: Emergency Medicine

## 2023-04-10 DIAGNOSIS — R002 Palpitations: Secondary | ICD-10-CM | POA: Diagnosis not present

## 2023-04-10 DIAGNOSIS — R059 Cough, unspecified: Secondary | ICD-10-CM | POA: Insufficient documentation

## 2023-04-10 DIAGNOSIS — E860 Dehydration: Secondary | ICD-10-CM | POA: Insufficient documentation

## 2023-04-10 DIAGNOSIS — Z87891 Personal history of nicotine dependence: Secondary | ICD-10-CM | POA: Insufficient documentation

## 2023-04-10 DIAGNOSIS — R0789 Other chest pain: Secondary | ICD-10-CM | POA: Diagnosis not present

## 2023-04-10 DIAGNOSIS — R55 Syncope and collapse: Secondary | ICD-10-CM | POA: Insufficient documentation

## 2023-04-10 LAB — CBC
HCT: 41.8 % (ref 36.0–46.0)
Hemoglobin: 14 g/dL (ref 12.0–15.0)
MCH: 30.8 pg (ref 26.0–34.0)
MCHC: 33.5 g/dL (ref 30.0–36.0)
MCV: 91.9 fL (ref 80.0–100.0)
Platelets: 300 10*3/uL (ref 150–400)
RBC: 4.55 MIL/uL (ref 3.87–5.11)
RDW: 12.8 % (ref 11.5–15.5)
WBC: 6.2 10*3/uL (ref 4.0–10.5)
nRBC: 0 % (ref 0.0–0.2)

## 2023-04-10 LAB — BASIC METABOLIC PANEL
Anion gap: 12 (ref 5–15)
BUN: 15 mg/dL (ref 6–20)
CO2: 22 mmol/L (ref 22–32)
Calcium: 10 mg/dL (ref 8.9–10.3)
Chloride: 105 mmol/L (ref 98–111)
Creatinine, Ser: 0.96 mg/dL (ref 0.44–1.00)
GFR, Estimated: >60 mL/min (ref >60)
Glucose, Bld: 98 mg/dL (ref 70–99)
Potassium: 3.6 mmol/L (ref 3.5–5.1)
Sodium: 139 mmol/L (ref 135–145)

## 2023-04-10 LAB — RESP PANEL BY RT-PCR (RSV, FLU A&B, COVID)  RVPGX2
Influenza A by PCR: NEGATIVE
Influenza B by PCR: NEGATIVE
Resp Syncytial Virus by PCR: NEGATIVE
SARS Coronavirus 2 by RT PCR: NEGATIVE

## 2023-04-10 LAB — TROPONIN I (HIGH SENSITIVITY)
Troponin I (High Sensitivity): 3 ng/L (ref <18)
Troponin I (High Sensitivity): 3 ng/L (ref <18)

## 2023-04-10 LAB — HCG, SERUM, QUALITATIVE: Preg, Serum: NEGATIVE

## 2023-04-10 MED ORDER — ONDANSETRON HCL 4 MG/2ML IJ SOLN
4.0000 mg | Freq: Once | INTRAMUSCULAR | Status: AC
  Administered 2023-04-10: 4 mg via INTRAVENOUS
  Filled 2023-04-10: qty 2

## 2023-04-10 MED ORDER — ONDANSETRON HCL 4 MG PO TABS: 4.0000 mg | ORAL_TABLET | Freq: Three times a day (TID) | ORAL | 0 refills | Status: AC | PRN

## 2023-04-10 MED ORDER — SODIUM CHLORIDE 0.9 % IV BOLUS
1000.0000 mL | Freq: Once | INTRAVENOUS | Status: AC
  Administered 2023-04-10: 1000 mL via INTRAVENOUS

## 2023-04-10 NOTE — ED Notes (Signed)
 Patient transported to X-ray

## 2023-04-10 NOTE — Discharge Instructions (Addendum)
 We evaluated you for your loss of consciousness.  Your symptoms are most likely due to dehydration.  Your testing including your cardiac testing and EKG was reassuring.    Please follow-up closely with your primary care doctor.  Please be sure to drink lots of fluids.  We have prescribed you a refill of nausea medication.  You can take this every 8 hours as needed for nausea or vomiting.  If you have any new or worsening symptoms such as recurrent episodes of fainting, persistent lightheadedness or weakness, difficulty breathing, uncontrolled vomiting, or any other new symptoms, please return to the emergency department.

## 2023-04-10 NOTE — ED Triage Notes (Signed)
 Pt has felt bad over the last week. Hx of seasonal allergies.  Friday afternoon had episode of heart racing, chest pressure and diaphoresis.  Around 7 am pt had similar episode and states she passed out twice.  Pt endorses 6/10 chest pressure at this time.

## 2023-04-10 NOTE — ED Provider Notes (Signed)
 [place] EMERGENCY DEPARTMENT AT [facility] Provider Note  CSN: [id number] Arrival date & time: 04/10/23 2841  Chief Complaint(s) CP, palp and Loss of Consciousness  HPI Molly Ellis is a 53 y.o. female history of tobacco use presenting to the emergency department with syncope.  Patient reports that over the past week, she has been dealing with cough, runny nose, sore throat, fatigue, nausea and vomiting, decreased appetite, diarrhea.  No sick contacts.  She reports decreased p.o. intake.  She reports that today, she stood up and ambulated to the bathroom and then felt lightheaded, had loss of consciousness.  She reports that she did have some chest pain and palpitations.  No back pain.  No abdominal pain.  She reports that she had 2 episodes of loss of consciousness, and unsure how long but likely brief.  She is not sure if she hit her head, denies headache or other pain after falling.  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   Tobacco abuse 12/10/2013   Patient Active Problem List   Diagnosis Date Noted   Family history of premature coronary artery disease 03/05/2020   Angina, class III (HCC) 02/25/2020   Mixed hyperlipidemia 02/25/2020   Kidney stones    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 Authorizing Provider  ALPRAZolam (XANAX) 0.25 MG tablet Take 0.25 mg by mouth 2 (two) times daily as needed (Headache).    [provider]  cephALEXin (KEFLEX) 500 MG capsule Take 1 capsule (500 mg total) by mouth 2 (two) times daily. 07/21/22   Mardella Layman, MD  cetirizine (ZYRTEC) 10 MG tablet Take 10 mg by mouth daily.     [provider]  cyclobenzaprine (FLEXERIL) 5 MG tablet Take 1 tablet (5 mg total) by mouth 2 (two) times daily as needed. Patient taking differently: Take 5 mg by mouth 2 (two) times daily as needed for muscle spasms. 10/22/20   Charlynne Pander, MD  fluticasone [facility]) 50 MCG/ACT nasal spray Place 1 spray into both nostrils daily as needed for allergies.    [provider]  Multiple Vitamins-Minerals (MULTIVITAMIN WITH MINERALS) tablet Take 1 tablet by mouth 3 (three) times a week. olly    [provider]  nitroGLYCERIN (NITROSTAT) 0.4 MG SL tablet Place 1 tablet (0.4 mg total) under the tongue every 5 (five) minutes as needed for chest pain. Max 3 doses 02/25/20   Tobb, Kardie, DO  ondansetron (ZOFRAN) 4 MG tablet Take 1 tablet (4 mg total) by mouth every 8 (eight) hours as needed for nausea or vomiting. 04/10/23   Lonell Grandchild,  MD  OVER THE COUNTER MEDICATION Take 20 mg by mouth daily as needed (Pain). delta 8 gummies    [provider]  phenazopyridine (PYRIDIUM) 200 MG tablet Take 1 tablet (200 mg total) by mouth 3 (three) times daily. 07/21/22   Mardella Layman, MD  rosuvastatin (CRESTOR) 10 MG tablet Take 1 tablet (10 mg total) by mouth daily. 11/06/20   Georgeanna Lea, MD                                                                                                                                    Past Surgical  History Past Surgical History:  Procedure Laterality Date   CARDIAC CATHETERIZATION     CHOLECYSTECTOMY     ENDOMETRIAL ABLATION     LAPAROSCOPIC OVARIAN CYSTECTOMY     LEFT HEART CATH AND CORONARY ANGIOGRAPHY N/A 03/05/2020   Procedure: LEFT HEART CATH AND CORONARY ANGIOGRAPHY;  Surgeon: Marykay Lex, MD;  Location: [facility] INVASIVE CV LAB;  Service: Cardiovascular;  Laterality: N/A;   TUBAL LIGATION     Family History Family History  Problem Relation Age of Onset   Asthma Mother    Hypertension Mother    Stroke Mother    Hypertension Father    Heart failure Maternal Grandfather    Heart attack Maternal Grandfather    Prostate cancer Maternal Uncle    Breast cancer Neg Hx    Ovarian cancer Neg Hx    Colon cancer Neg Hx     Social History Social History   Tobacco Use   Smoking status: Former    Current packs/day: 0.00    Types: Cigarettes    Quit date: 03/03/2020    Years since quitting: 3.1   Smokeless tobacco: Never  Vaping Use   Vaping status: Never Used  Substance Use Topics   Alcohol use: Yes    Alcohol/week: 1.0 standard drink of alcohol    Types: 1 Cans of beer per week    Comment: occ   Drug use: Yes    Types: Marijuana   Allergies Penicillins, Codeine, Cymbalta [duloxetine hcl], and Other  Review of Systems Review of Systems  All other systems reviewed and are negative.   Physical Exam Vital Signs  I have reviewed the triage vital signs BP (!) 141/74   Pulse (!) 53   Temp 98.1 F (36.7 C) (Oral)   Resp 15   Ht 5\' 6"  (1.676 m)   Wt 48.1 kg   SpO2 100%   BMI 17.11 kg/m  Physical Exam Vitals and nursing note reviewed.  Constitutional:      General: She is not in acute distress.    Appearance: She is well-developed.  HENT:     Head: Normocephalic and atraumatic.     Mouth/Throat:     Mouth: Mucous membranes are moist.  Eyes:     Pupils: Pupils are equal, round, and reactive to light.  Cardiovascular:  Rate and Rhythm: Normal rate and  regular rhythm.     Heart sounds: No murmur heard. Pulmonary:     Effort: Pulmonary effort is normal. No respiratory distress.     Breath sounds: Normal breath sounds.  Abdominal:     General: Abdomen is flat.     Palpations: Abdomen is soft.     Tenderness: There is no abdominal tenderness.  Musculoskeletal:        General: No tenderness.     Right lower leg: No edema.     Left lower leg: No edema.  Skin:    General: Skin is warm and dry.  Neurological:     General: No focal deficit present.     Mental Status: She is alert. Mental status is at baseline.  Psychiatric:        Mood and Affect: Mood normal.        Behavior: Behavior normal.     ED Results and Treatments Labs (all labs ordered are listed, but only abnormal results are displayed) Labs Reviewed  RESP PANEL BY RT-PCR (RSV, FLU A&B, COVID)  RVPGX2  BASIC METABOLIC PANEL  CBC  HCG, SERUM, QUALITATIVE  TROPONIN I (HIGH SENSITIVITY)  TROPONIN I (HIGH SENSITIVITY)                                                                                                                          Radiology DG Chest 2 View Result Date: 04/10/2023 CLINICAL DATA:  53 year old female with palpitations, chest pressure, chest pain, diaphoresis, syncope. EXAM: CHEST - 2 VIEW COMPARISON:  CTA chest 04/04/2016 and earlier. FINDINGS: PA and lateral views at 0956 hours. Lung volumes are stable at the upper limits of normal. Normal cardiac size and mediastinal contours. Visualized tracheal air column is within normal limits. Both lungs appear stable and clear. No pneumothorax or pleural effusion. Stable cholecystectomy clips. No acute osseous abnormality identified. Negative visible bowel gas. IMPRESSION: No acute cardiopulmonary abnormality. Electronically Signed   By: Odessa Fleming M.D.   On: 04/10/2023 10:09    Pertinent labs & imaging results that were available during my care of the patient were reviewed by me and considered in my medical decision  making (see MDM for details).  Medications Ordered in ED Medications  sodium chloride 0.9 % bolus 1,000 mL (0 mLs Intravenous Stopped 04/10/23 1231)  ondansetron (ZOFRAN) injection 4 mg (4 mg Intravenous Given 04/10/23 1124)  Procedures Procedures  (including critical care time)  Medical Decision Making / ED Course   MDM:  53 year old presenting to the emergency department with syncopal event.  Suspect syncope most likely due to orthostatic or vasovagal syncope.  She reports that occurred after standing.  She reports significantly decreased p.o. intake in the setting of recent illness.  Symptoms seem most consistent with viral URI.  Will check flu and COVID swab, give fluids, check orthostatics.  Low concern for cardiac cause of syncope.  Patient has no history of any abnormal cardiac condition.  She had a left heart cath 3 years ago which showed normal coronary arteries.  Her EKG shows chronic right bundle branch block without change.  Low concern for other process such as pulmonary embolism, no tachycardia, hypoxia, tachypnea, pleuritic chest pain.  Will reassess after fluids, if improved, likely discharge with outpatient follow-up.  Clinical Course as of 04/10/23 1236  Sun Apr 10, 2023  1234 Patient feeling better after receiving IV fluids.  Patient was able to ambulate to the bathroom without difficulty.  Her orthostatics were positive and she feels better after IV fluids so suspect dehydration is the primary cause of her symptoms.  Flu/COVID testing is negative.  I believe patient is safe for discharge, recommended close outpatient follow-up with her primary care physician, strict ER precautions. Will discharge patient to home. All questions answered. Patient comfortable with plan of discharge. Return precautions discussed with patient and specified on the  after visit summary.  [WS]    Clinical Course User Index [WS] Lonell Grandchild, MD     Additional history obtained:  -External records from outside source obtained and reviewed including: Chart review including previous notes, labs, imaging, consultation notes including prior cardiology notes    Lab Tests: -I ordered, reviewed, and interpreted labs.   The pertinent results include:   Labs Reviewed  RESP PANEL BY RT-PCR (RSV, FLU A&B, COVID)  RVPGX2  BASIC METABOLIC PANEL  CBC  HCG, SERUM, QUALITATIVE  TROPONIN I (HIGH SENSITIVITY)  TROPONIN I (HIGH SENSITIVITY)    Notable for normal findings   EKG   EKG Interpretation Date/Time:  Sunday April 10 2023 09:41:36 EDT Ventricular Rate:  72 PR Interval:  142 QRS Duration:  106 QT Interval:  380 QTC Calculation: 416 R Axis:   108  Text Interpretation: Normal sinus rhythm Incomplete right bundle branch block When compared with ECG of 04-Apr-2016 16:09, No significant change since last tracing Confirmed by Alvino Blood ([id number]) on 04/10/2023 9:57:21 AM         Imaging Studies ordered: I ordered imaging studies including CXR On my interpretation imaging demonstrates no acute process I independently visualized and interpreted imaging. I agree with the radiologist interpretation   Medicines ordered and prescription drug management: Meds ordered this encounter  Medications   sodium chloride 0.9 % bolus 1,000 mL   ondansetron (ZOFRAN) injection 4 mg   ondansetron (ZOFRAN) 4 MG tablet    Sig: Take 1 tablet (4 mg total) by mouth every 8 (eight) hours as needed for nausea or vomiting.    Dispense:  20 tablet    Refill:  0    -I have reviewed the patients home medicines and have made adjustments as needed   Cardiac Monitoring: The patient was maintained on a cardiac monitor.  I personally viewed and interpreted the cardiac monitored which showed an underlying rhythm of: NSR  Social Determinants of Health:   Diagnosis or treatment significantly limited by social determinants of  health: former smoker   Reevaluation: After the interventions noted above, I reevaluated the patient and found that their symptoms have improved  Co morbidities that complicate the patient evaluation  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 12/12/2017   Chest pain of uncertain etiology 12/10/2013   Family history of Graves' disease 11/04/2017   Heart attack (HCC) 01/2014   History of removal of ovarian cyst 11/04/2017   Hypercholesterolemia 12/10/2013   Kidney stones    Kidney stones    Migraine 12/10/2013   Mixed hyperlipidemia 02/25/2020   RBBB 12/10/2013   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    Final Clinical Impression(s) / ED Diagnoses Final diagnoses:  Syncope, unspecified syncope type  Dehydration     This chart was dictated using voice recognition software.  Despite best efforts to proofread,  errors can occur which can change the documentation meaning.    Lonell Grandchild, MD 04/10/23 [phone]

## 2023-04-30 DIAGNOSIS — Z419 Encounter for procedure for purposes other than remedying health state, unspecified: Secondary | ICD-10-CM | POA: Diagnosis not present

## 2023-05-30 DIAGNOSIS — Z419 Encounter for procedure for purposes other than remedying health state, unspecified: Secondary | ICD-10-CM | POA: Diagnosis not present

## 2023-06-30 DIAGNOSIS — Z419 Encounter for procedure for purposes other than remedying health state, unspecified: Secondary | ICD-10-CM | POA: Diagnosis not present

## 2023-07-30 DIAGNOSIS — Z419 Encounter for procedure for purposes other than remedying health state, unspecified: Secondary | ICD-10-CM | POA: Diagnosis not present

## 2023-08-30 DIAGNOSIS — Z419 Encounter for procedure for purposes other than remedying health state, unspecified: Secondary | ICD-10-CM | POA: Diagnosis not present

## 2023-09-30 DIAGNOSIS — Z419 Encounter for procedure for purposes other than remedying health state, unspecified: Secondary | ICD-10-CM | POA: Diagnosis not present

## 2024-02-08 ENCOUNTER — Encounter (HOSPITAL_BASED_OUTPATIENT_CLINIC_OR_DEPARTMENT_OTHER): Payer: Self-pay | Admitting: Family Medicine

## 2024-02-08 ENCOUNTER — Ambulatory Visit (INDEPENDENT_AMBULATORY_CARE_PROVIDER_SITE_OTHER): Admitting: Family Medicine

## 2024-02-08 ENCOUNTER — Telehealth (HOSPITAL_BASED_OUTPATIENT_CLINIC_OR_DEPARTMENT_OTHER): Payer: Self-pay | Admitting: *Deleted

## 2024-02-08 VITALS — BP 106/64 | HR 65 | Temp 98.2°F | Resp 16 | Wt 109.5 lb

## 2024-02-08 DIAGNOSIS — R5383 Other fatigue: Secondary | ICD-10-CM | POA: Diagnosis not present

## 2024-02-08 DIAGNOSIS — Z1231 Encounter for screening mammogram for malignant neoplasm of breast: Secondary | ICD-10-CM | POA: Diagnosis not present

## 2024-02-08 DIAGNOSIS — Z87898 Personal history of other specified conditions: Secondary | ICD-10-CM | POA: Insufficient documentation

## 2024-02-08 DIAGNOSIS — Z13220 Encounter for screening for lipoid disorders: Secondary | ICD-10-CM

## 2024-02-08 DIAGNOSIS — G43E09 Chronic migraine with aura, not intractable, without status migrainosus: Secondary | ICD-10-CM

## 2024-02-08 DIAGNOSIS — E559 Vitamin D deficiency, unspecified: Secondary | ICD-10-CM | POA: Diagnosis not present

## 2024-02-08 DIAGNOSIS — I951 Orthostatic hypotension: Secondary | ICD-10-CM | POA: Insufficient documentation

## 2024-02-08 MED ORDER — FLUDROCORTISONE ACETATE 0.1 MG PO TABS: 0.0500 mg | ORAL_TABLET | Freq: Every day | ORAL | 2 refills | Status: AC

## 2024-02-08 MED ORDER — AMITRIPTYLINE HCL 25 MG PO TABS: 25.0000 mg | ORAL_TABLET | Freq: Every day | ORAL | 1 refills | Status: AC

## 2024-02-08 NOTE — Assessment & Plan Note (Addendum)
 Recurrent episodes of dizziness and near-syncope, with a significant episode two months ago. Symptoms worsen with standing and improve with sitting. Previous reluctance to start medication due to long-term commitment, but now considering treatment due to frequency of symptoms. - Will initiate low-dose medication to increase blood pressure, coordinating with cardiologist Dr. Krasowski. - Advised on hydration and minimize caffeine intake. - Ordered blood work to assess current status. Orders:   fludrocortisone  (FLORINEF ) 0.1 MG tablet; Take 0.5 tablets (0.05 mg total) by mouth daily.

## 2024-02-08 NOTE — Progress Notes (Signed)
 "  Established Patient Office Visit  Subjective   Patient ID: Molly Ellis, female    DOB: Feb 04, 1970  Age: 55 y.o. MRN: [id number]  Chief Complaint  Patient presents with   Establish Care    Establish Care     Discussed the use of AI scribe software for clinical note transcription with the patient, who gave verbal consent to proceed.  History of Present Illness Molly Ellis is a 54 year old female with dizziness and migraines who presents with worsening dizziness and headache management.  She experiences dizziness that worsens upon standing, with episodes of near-syncope. She has passed out once two months ago, but did not hit the floor as her son caught her. Sitting before standing helps prevent these episodes. She describes these symptoms as frequent and 'annoying', though she has not been injured by them.  She does a good job already with hydration and regular, but not excessive, salt intake.  She has a history of migraines.  These headaches occur intermittently for periods ranging from three to fourteen days. She is not currently seeing a specialist for her headaches.  She has a history of heart disease and sees Cardiology.  She experiences occasional palpitations, referred to as 'gallops', but not frequently.  She quit smoking cigarettes a little over a year and a half ago, having smoked since she was [age], except during pregnancy. She used vaping to quit smoking. She occasionally smokes marijuana, particularly when in pain, as she prefers to avoid pain pills.  She struggles with maintaining her weight, noting difficulty gaining weight and occasional nausea at the thought of food. She has increased her protein intake by drinking protein shakes every other day.  She experiences worsening allergies each year and occasionally receives Kenalog shots for relief.     Past Medical History:  Diagnosis Date   CAD (coronary artery disease)    f/by Dr. Bernie   Cannabis  abuse    Former smoker    30+ year pack history before quitting 2024   History of cervical dysplasia    f/by [facility]   Hypercholesterolemia 12/10/2013   Kidney stones    Migraine    Mixed hyperlipidemia 02/25/2020   RBBB 12/10/2013    Outpatient Encounter Medications as of 02/08/2024  Medication Sig   ALPRAZolam (XANAX) 0.25 MG tablet Take 0.25 mg by mouth 2 (two) times daily as needed (Headache).   amitriptyline  (ELAVIL ) 25 MG tablet Take 1 tablet (25 mg total) by mouth at bedtime.   cetirizine (ZYRTEC) 10 MG tablet Take 10 mg by mouth daily.    fluticasone (FLONASE) 50 MCG/ACT nasal spray Place 1 spray into both nostrils daily as needed for allergies.   nitroGLYCERIN  (NITROSTAT ) 0.4 MG SL tablet Place 1 tablet (0.4 mg total) under the tongue every 5 (five) minutes as needed for chest pain. Max 3 doses   ondansetron  (ZOFRAN ) 4 MG tablet Take 1 tablet (4 mg total) by mouth every 8 (eight) hours as needed for nausea or vomiting.   OVER THE COUNTER MEDICATION Take 20 mg by mouth daily as needed (Pain). delta 8 gummies   phenazopyridine  (PYRIDIUM ) 200 MG tablet Take 1 tablet (200 mg total) by mouth 3 (three) times daily.   [DISCONTINUED] FLUCELVAX 0.5 ML injection    cephALEXin  (KEFLEX ) 500 MG capsule Take 1 capsule (500 mg total) by mouth 2 (two) times daily. (Patient not taking: Reported on 02/08/2024)   cyclobenzaprine  (FLEXERIL ) 5 MG tablet Take 1 tablet (5 mg total)  by mouth 2 (two) times daily as needed. (Patient taking differently: Take 5 mg by mouth 2 (two) times daily as needed for muscle spasms.)   Multiple Vitamins-Minerals (MULTIVITAMIN WITH MINERALS) tablet Take 1 tablet by mouth 3 (three) times a week. olly (Patient not taking: Reported on 02/08/2024)   rosuvastatin  (CRESTOR ) 10 MG tablet Take 1 tablet (10 mg total) by mouth daily. (Patient not taking: Reported on 02/08/2024)   No facility-administered encounter medications on file as of 02/08/2024.    Social History   Tobacco  Use   Smoking status: Former    Current packs/day: 0.00    Types: Cigarettes    Quit date: 03/03/2020    Years since quitting: 3.9   Smokeless tobacco: Never  Vaping Use   Vaping status: Never Used  Substance Use Topics   Alcohol use: Yes    Alcohol/week: 1.0 standard drink of alcohol    Types: 1 Cans of beer per week    Comment: occ   Drug use: Yes    Types: Marijuana      Review of Systems  Constitutional:  Negative for diaphoresis, fever, malaise/fatigue and weight loss.  Respiratory:  Negative for cough, shortness of breath and wheezing.   Cardiovascular:  Negative for chest pain, palpitations, orthopnea, claudication, leg swelling and PND.  Neurological:  Positive for dizziness and headaches. Negative for tremors, focal weakness, seizures and loss of consciousness.      Objective:     BP 106/64 (Cuff Size: Normal)   Pulse 65   Temp 98.2 F (36.8 C) (Oral)   Resp 16   Wt 109 lb 8 oz (49.7 kg)   SpO2 96%   BMI 17.67 kg/m    Physical Exam Constitutional:      General: She is not in acute distress.    Appearance: Normal appearance.     Comments: Lean build  HENT:     Head: Normocephalic.  Neck:     Vascular: No carotid bruit.  Cardiovascular:     Rate and Rhythm: Normal rate and regular rhythm.     Pulses: Normal pulses.     Heart sounds: Normal heart sounds.  Pulmonary:     Effort: Pulmonary effort is normal.     Breath sounds: Normal breath sounds.  Abdominal:     General: Bowel sounds are normal.     Palpations: Abdomen is soft.  Musculoskeletal:     Cervical back: Neck supple. No tenderness.     Right lower leg: No edema.     Left lower leg: No edema.  Neurological:     General: No focal deficit present.     Mental Status: She is alert.      No results found for any visits on 02/08/24.    The ASCVD Risk score (Arnett DK, et al., 2019) failed to calculate for the following reasons:   Risk score cannot be calculated because patient has a  medical history suggesting prior/existing ASCVD   * - Cholesterol units were assumed    Assessment & Plan:   Assessment & Plan Orthostatic hypotension Recurrent episodes of dizziness and near-syncope, with a significant episode two months ago. Symptoms worsen with standing and improve with sitting. Previous reluctance to start medication due to long-term commitment, but now considering treatment due to frequency of symptoms. - Will initiate low-dose medication to increase blood pressure, coordinating with cardiologist Dr. Krasowski. - Advised on hydration and minimize caffeine intake. - Ordered blood work to assess current status. Orders:  fludrocortisone  (FLORINEF ) 0.1 MG tablet; Take 0.5 tablets (0.05 mg total) by mouth daily.  History of syncope Discussion as above.    Fatigue, unspecified type Mild, reevaluate labs. Orders:   Comprehensive metabolic panel with GFR   CBC with Differential/Platelet   TSH   Vitamin B12  Lipid screening  Orders:   Lipid panel  Vitamin D  deficiency  Orders:   VITAMIN D  25 Hydroxy (Vit-D Deficiency, Fractures)  Breast cancer screening by mammogram  Orders:   MM 3D SCREENING MAMMOGRAM BILATERAL BREAST; Future  Chronic migraine with aura without status migrainosus, not intractable Chronic migraines with aura.  Current clusters of headaches lasting 3-4 days to two weeks. No current specialist involvement. Discussed prophylactic treatment options, with amitriptyline  considered due to its affordability and dual benefit for sleep and headache prevention. - Initiated amitriptyline  25 mg at bedtime for headache prophylaxis and sleep aid. - Instructed to monitor for effectiveness and side effects, with potential dose adjustment if necessary. Orders:   amitriptyline  (ELAVIL ) 25 MG tablet; Take 1 tablet (25 mg total) by mouth at bedtime.      Return in about 4 weeks (around 03/07/2024) for chronic follow-up.    REDDING PONCE NORLEEN FALCON., MD  "

## 2024-02-08 NOTE — Assessment & Plan Note (Addendum)
 Discussion as above

## 2024-02-08 NOTE — Assessment & Plan Note (Addendum)
 Chronic migraines with aura.  Current clusters of headaches lasting 3-4 days to two weeks. No current specialist involvement. Discussed prophylactic treatment options, with amitriptyline  considered due to its affordability and dual benefit for sleep and headache prevention. - Initiated amitriptyline  25 mg at bedtime for headache prophylaxis and sleep aid. - Instructed to monitor for effectiveness and side effects, with potential dose adjustment if necessary. Orders:   amitriptyline  (ELAVIL ) 25 MG tablet; Take 1 tablet (25 mg total) by mouth at bedtime.

## 2024-02-08 NOTE — Telephone Encounter (Signed)
 Copied from CRM #[id number]. Topic: Clinical - Medication Question >> Feb 08, 2024 10:50 AM Laymon HERO wrote: Reason for CRM: Patient was seen earlier today with Dr Dottie and she thought she mentioned that he was going to put her on a medication for Low blood pressure- she went to pharmacy and there was only 1 for migraines

## 2024-02-08 NOTE — Telephone Encounter (Signed)
 Routing to Dr. Dottie for further review.

## 2024-02-09 ENCOUNTER — Ambulatory Visit (HOSPITAL_BASED_OUTPATIENT_CLINIC_OR_DEPARTMENT_OTHER): Payer: Self-pay | Admitting: Family Medicine

## 2024-02-09 LAB — COMPREHENSIVE METABOLIC PANEL WITH GFR
ALT: 18 IU/L (ref 0–32)
AST: 22 IU/L (ref 0–40)
Albumin: 4.7 g/dL (ref 3.8–4.9)
Alkaline Phosphatase: 91 IU/L (ref 49–135)
BUN/Creatinine Ratio: 19 (ref 9–23)
BUN: 15 mg/dL (ref 6–24)
Bilirubin Total: 0.3 mg/dL (ref 0.0–1.2)
CO2: 21 mmol/L (ref 20–29)
Calcium: 9.9 mg/dL (ref 8.7–10.2)
Chloride: 103 mmol/L (ref 96–106)
Creatinine, Ser: 0.77 mg/dL (ref 0.57–1.00)
Globulin, Total: 2.8 g/dL (ref 1.5–4.5)
Glucose: 90 mg/dL (ref 70–99)
Potassium: 4.3 mmol/L (ref 3.5–5.2)
Sodium: 141 mmol/L (ref 134–144)
Total Protein: 7.5 g/dL (ref 6.0–8.5)
eGFR: 93 mL/min/1.73

## 2024-02-09 LAB — CBC WITH DIFFERENTIAL/PLATELET
Basophils Absolute: 0.1 x10E3/uL (ref 0.0–0.2)
Basos: 1 %
EOS (ABSOLUTE): 0.2 x10E3/uL (ref 0.0–0.4)
Eos: 3 %
Hematocrit: 41.7 % (ref 34.0–46.6)
Hemoglobin: 13.8 g/dL (ref 11.1–15.9)
Immature Grans (Abs): 0 x10E3/uL (ref 0.0–0.1)
Immature Granulocytes: 0 %
Lymphocytes Absolute: 1.5 x10E3/uL (ref 0.7–3.1)
Lymphs: 27 %
MCH: 31.5 pg (ref 26.6–33.0)
MCHC: 33.1 g/dL (ref 31.5–35.7)
MCV: 95 fL (ref 79–97)
Monocytes Absolute: 0.5 x10E3/uL (ref 0.1–0.9)
Monocytes: 8 %
Neutrophils Absolute: 3.3 x10E3/uL (ref 1.4–7.0)
Neutrophils: 61 %
Platelets: 323 x10E3/uL (ref 150–450)
RBC: 4.38 x10E6/uL (ref 3.77–5.28)
RDW: 12.6 % (ref 11.7–15.4)
WBC: 5.5 x10E3/uL (ref 3.4–10.8)

## 2024-02-09 LAB — VITAMIN B12: Vitamin B-12: 1035 pg/mL (ref 232–1245)

## 2024-02-09 LAB — LIPID PANEL
Chol/HDL Ratio: 3.2 ratio (ref 0.0–4.4)
Cholesterol, Total: 225 mg/dL — ABNORMAL HIGH (ref 100–199)
HDL: 70 mg/dL
LDL Chol Calc (NIH): 144 mg/dL — ABNORMAL HIGH (ref 0–99)
Triglycerides: 66 mg/dL (ref 0–149)
VLDL Cholesterol Cal: 11 mg/dL (ref 5–40)

## 2024-02-09 LAB — VITAMIN D 25 HYDROXY (VIT D DEFICIENCY, FRACTURES): Vit D, 25-Hydroxy: 15 ng/mL — ABNORMAL LOW (ref 30.0–100.0)

## 2024-02-09 LAB — TSH: TSH: 2.11 u[IU]/mL (ref 0.450–4.500)

## 2024-02-28 ENCOUNTER — Ambulatory Visit (HOSPITAL_BASED_OUTPATIENT_CLINIC_OR_DEPARTMENT_OTHER): Admitting: Radiology

## 2024-03-07 ENCOUNTER — Ambulatory Visit (HOSPITAL_BASED_OUTPATIENT_CLINIC_OR_DEPARTMENT_OTHER): Admitting: Family Medicine
